# Patient Record
Sex: Female | Born: 1960 | Race: Black or African American | Hispanic: No | Marital: Single | Smoking: Former smoker
Health system: Southern US, Community
[De-identification: ages and names within clinical notes are randomized; demographics above are authoritative.]

## PROBLEM LIST (undated history)

## (undated) DIAGNOSIS — I10 Essential (primary) hypertension: Secondary | ICD-10-CM

## (undated) HISTORY — DX: Essential (primary) hypertension: I10

## (undated) HISTORY — PX: ABCESS DRAINAGE: SHX399

---

## 2007-01-19 ENCOUNTER — Emergency Department (HOSPITAL_COMMUNITY): Admission: EM | Admit: 2007-01-19 | Discharge: 2007-01-19 | Payer: Self-pay | Admitting: Family Medicine

## 2007-02-08 ENCOUNTER — Emergency Department (HOSPITAL_COMMUNITY): Admission: EM | Admit: 2007-02-08 | Discharge: 2007-02-08 | Payer: Self-pay | Admitting: Emergency Medicine

## 2007-02-22 ENCOUNTER — Emergency Department (HOSPITAL_COMMUNITY): Admission: EM | Admit: 2007-02-22 | Discharge: 2007-02-22 | Payer: Self-pay | Admitting: Emergency Medicine

## 2007-04-25 ENCOUNTER — Ambulatory Visit: Payer: Self-pay | Admitting: *Deleted

## 2007-04-25 ENCOUNTER — Ambulatory Visit: Payer: Self-pay | Admitting: Internal Medicine

## 2007-04-26 ENCOUNTER — Encounter (INDEPENDENT_AMBULATORY_CARE_PROVIDER_SITE_OTHER): Payer: Self-pay | Admitting: Nurse Practitioner

## 2007-04-26 LAB — CONVERTED CEMR LAB
ALT: 18 units/L (ref 0–35)
AST: 17 units/L (ref 0–37)
Alkaline Phosphatase: 80 units/L (ref 39–117)
BUN: 13 mg/dL (ref 6–23)
Basophils Absolute: 0.1 10*3/uL (ref 0.0–0.1)
Basophils Relative: 1 % (ref 0–1)
Calcium: 8.7 mg/dL (ref 8.4–10.5)
Chloride: 109 meq/L (ref 96–112)
Creatinine, Ser: 0.91 mg/dL (ref 0.40–1.20)
Eosinophils Absolute: 0.1 10*3/uL (ref 0.0–0.7)
HDL: 50 mg/dL (ref >39)
Hemoglobin: 12.1 g/dL (ref 12.0–15.0)
LDL Cholesterol: 99 mg/dL (ref 0–99)
MCHC: 30.5 g/dL (ref 30.0–36.0)
MCV: 89.2 fL (ref 78.0–100.0)
Monocytes Absolute: 0.4 10*3/uL (ref 0.2–0.7)
Neutro Abs: 1.3 10*3/uL — ABNORMAL LOW (ref 1.7–7.7)
RDW: 15.7 % — ABNORMAL HIGH (ref 11.5–14.0)
TSH: 1.194 microintl units/mL (ref 0.350–5.50)
Total Bilirubin: 0.3 mg/dL (ref 0.3–1.2)
Total CHOL/HDL Ratio: 3.3
VLDL: 15 mg/dL (ref 0–40)

## 2007-06-11 ENCOUNTER — Emergency Department (HOSPITAL_COMMUNITY): Admission: EM | Admit: 2007-06-11 | Discharge: 2007-06-11 | Payer: Self-pay | Admitting: Emergency Medicine

## 2007-06-20 ENCOUNTER — Ambulatory Visit: Payer: Self-pay | Admitting: Internal Medicine

## 2008-03-17 ENCOUNTER — Ambulatory Visit: Payer: Self-pay | Admitting: Internal Medicine

## 2009-01-12 ENCOUNTER — Emergency Department (HOSPITAL_COMMUNITY): Admission: EM | Admit: 2009-01-12 | Discharge: 2009-01-12 | Payer: Self-pay | Admitting: Emergency Medicine

## 2009-01-13 ENCOUNTER — Ambulatory Visit (HOSPITAL_COMMUNITY): Admission: RE | Admit: 2009-01-13 | Discharge: 2009-01-14 | Payer: Self-pay | Admitting: General Surgery

## 2010-07-06 ENCOUNTER — Emergency Department (HOSPITAL_COMMUNITY): Admission: EM | Admit: 2010-07-06 | Discharge: 2010-07-06 | Payer: Self-pay | Admitting: Family Medicine

## 2011-01-12 LAB — POCT I-STAT, CHEM 8
Calcium, Ion: 1.08 mmol/L — ABNORMAL LOW (ref 1.12–1.32)
HCT: 33 % — ABNORMAL LOW (ref 36.0–46.0)
Hemoglobin: 11.2 g/dL — ABNORMAL LOW (ref 12.0–15.0)
Sodium: 141 mEq/L (ref 135–145)
TCO2: 23 mmol/L (ref 0–100)

## 2011-01-12 LAB — BASIC METABOLIC PANEL
CO2: 25 mEq/L (ref 19–32)
Calcium: 8.4 mg/dL (ref 8.4–10.5)
Calcium: 8.7 mg/dL (ref 8.4–10.5)
Creatinine, Ser: 0.9 mg/dL (ref 0.4–1.2)
Creatinine, Ser: 0.95 mg/dL (ref 0.4–1.2)
GFR calc Af Amer: >60 mL/min (ref >60)
GFR calc Af Amer: >60 mL/min (ref >60)
GFR calc non Af Amer: >60 mL/min (ref >60)
Sodium: 138 mEq/L (ref 135–145)
Sodium: 139 mEq/L (ref 135–145)

## 2011-01-12 LAB — HCG, SERUM, QUALITATIVE: Preg, Serum: NEGATIVE

## 2011-01-12 LAB — CULTURE, ROUTINE-ABSCESS

## 2011-01-12 LAB — CBC
Hemoglobin: 10 g/dL — ABNORMAL LOW (ref 12.0–15.0)
Hemoglobin: 11.1 g/dL — ABNORMAL LOW (ref 12.0–15.0)
MCHC: 32.7 g/dL (ref 30.0–36.0)
RBC: 3.5 MIL/uL — ABNORMAL LOW (ref 3.87–5.11)
RBC: 3.9 MIL/uL (ref 3.87–5.11)
WBC: 7.9 10*3/uL (ref 4.0–10.5)

## 2011-01-12 LAB — ANAEROBIC CULTURE

## 2011-02-15 NOTE — Discharge Summary (Signed)
Betty Adams, Betty Adams               ACCOUNT NO.:  [id number]   MEDICAL RECORD NO.:  [id number]          PATIENT TYPE:  OIB   LOCATION:  1524                         FACILITY:  [facility]   PHYSICIAN:  Clovis Pu. Cornett, M.D.DATE OF BIRTH:  May 08, 1961   DATE OF ADMISSION:  01/13/2009  DATE OF DISCHARGE:  01/14/2009                               DISCHARGE SUMMARY   ADMITTING DIAGNOSIS:  Right posterior thigh abscess.   DISCHARGE DIAGNOSIS:  Right posterior thigh abscess.   PROCEDURE PERFORMED:  Incision and drainage of right posterior thigh  abscess.   BRIEF HISTORY:  The patient is a 50 year old female with a one-week  history of posterior right thigh abscess.  She had seen Dr. Donell Beers in  the urgent office and was sent to [facility] for drainage.   HOSPITAL COURSE:  Her hospital course was unremarkable.  She underwent  incision and drainage of right posterior thigh abscess without  difficulty.   DISCHARGE INSTRUCTIONS:  She was discharged home on postoperative day  #1.  Home health will be arranged for wound care.  She will be sent home  on Augmentin 875 mg p.o. b.i.d., Percocet 1-2 tablets q.4h. p.r.n. pain.  I also gave her a prescription for clindamycin 300 mg p.o. t.i.d. if the  Augmentin is too expensive for her.  She will return to clinic in  roughly 2 weeks, and home health will be arranged.   CONDITION AT DISCHARGE:  Improved.      Thomas A. Cornett, M.D.  Electronically Signed     TAC/MEDQ  D:  01/14/2009  T:  01/14/2009  Job:  [id number]

## 2011-02-15 NOTE — Op Note (Signed)
NAMEBRIANI, MAUL NO.:  [id number]   MEDICAL RECORD NO.:  [id number]          PATIENT TYPE:  OUT   LOCATION:  DAY                          FACILITY:  [facility]   PHYSICIAN:  Clovis Pu. Cornett, M.D.DATE OF BIRTH:  06-01-1961   DATE OF PROCEDURE:  01/13/2009  DATE OF DISCHARGE:                               OPERATIVE REPORT   PREOPERATIVE DIAGNOSIS:  Right thigh abscess.   POSTOPERATIVE DIAGNOSIS:  Right thigh abscess.   PROCEDURE:  Incision and drainage of right thigh abscess.   SURGEON:  Maisie Fus A. Cornett, MD.   ANESTHESIA:  General endotracheal anesthesia.   ESTIMATED BLOOD LOSS:  5 mL.   SPECIMENS:  Cultures taken from right thigh abscess.   INDICATIONS FOR PROCEDURE:  The patient is a 50 year old female, who has  had a 1-week history of a slowly progressive abscess on her right  posterior thigh.  She was seen today in the office by Dr. Almond Lint.  It was too large to be drained in the office, and she was sent here to  [facility] to have her abscess drained.  I saw the patient in the  holding area, discussed the procedure with her, reviewed her records,  and concurred with Dr. Arita Miss assessment.  She agreed to proceed.   DESCRIPTION OF PROCEDURE:  The patient was brought to the operating  room.  She was initially placed supine and intubated.  She was then  placed with her left side down, appropriately padded, and raised her  right thigh to the area of the posterior aspect of her posterior right  thigh.  This area was prepped and draped in a usual fashion and measured  about 3 cm.  I opened the central part of this and debrided the center  part of necrotic skin.  About a 3 cm abscess cavity was encountered.  There was some residual pus and loculation that I broke up and then I  irrigated this out and packed it after obtaining hemostasis with a  saline-soaked gauze.  Dry dressings were applied.  The patient was taken  out of her left-side-down  position, placed supine, extubated, and taken  to recovery in satisfactory condition.  All catheters, sponge, and  instrument counts found to be correct.      Thomas A. Cornett, M.D.  Electronically Signed    TAC/MEDQ  D:  01/13/2009  T:  01/13/2009  Job:  [id number]   cc:   Maurice March, M.D.  Fax: [phone]

## 2011-02-15 NOTE — H&P (Signed)
NAMETAELAR, GRONEWOLD NO.:  [id number]   MEDICAL RECORD NO.:  [id number]          PATIENT TYPE:  EMS   LOCATION:  MAJO                         FACILITY:  [facility]   PHYSICIAN:  Almond Lint, MD       DATE OF BIRTH:  Apr 05, 1961   DATE OF ADMISSION:  01/12/2009  DATE OF DISCHARGE:                              HISTORY & PHYSICAL   CHIEF COMPLAINT:  Right thigh abscess.   HISTORY OF PRESENT ILLNESS:  Ms. Masri is a 50 year old female with  around a week history of a right thigh wound.  She thought this started  as a spider bite and applied some warm compresses.  It kept getting  worse and larger with swelling and pain and so she went to the emergency  department last night.  It had started draining some so they gave her IM  Rocephin and had her follow up with [place] today.  She continues to have  severe pain, redness and swelling of this area and minimal drainage.   PAST MEDICAL HISTORY:  Significant for asthma.   PAST SURGICAL HISTORY:  BTL.   MEDICATIONS:  P.r.n. albuterol.   FAMILY HISTORY:  Mother with breast cancer and diabetes.   SOCIAL HISTORY:  She is single and smokes 1 packet of cigarettes every 3  days.   REVIEW OF SYSTEMS:  Otherwise negative x10 systems.   PHYSICAL EXAMINATION:  Temperature 98.2, pulse 87, blood pressure  142/84.  Height 5 feet 4 inches.  Weight 264 pounds.  Alert and oriented  x3, obviously uncomfortable.  Normal mood and affect.  ABDOMEN: Soft.  Her right upper thigh demonstrates a fluctuant lesion that is  exquisitely tender.  Even brief touches make her extremely  uncomfortable.  There is a small punctate area that has some drainage  coming out of it, but there is still fluctuant region of around 2 cm  underlying this.  EXTREMITIES:  Demonstrate no edema.   ASSESSMENT/PLAN:  Ms. Canny is a 50 year old female with a right  posterior thigh abscess.  We will place her on intravenous antibiotics  and give her intravenous pain  control.  She will undergo incision and  drainage in the operating room tonight.  I have discussed this with  Dr.  Luisa Hart.      Almond Lint, MD  Electronically Signed    FB/MEDQ  D:  01/13/2009  T:  01/13/2009  Job:  [id number]

## 2011-08-17 ENCOUNTER — Emergency Department (HOSPITAL_COMMUNITY)
Admission: EM | Admit: 2011-08-17 | Discharge: 2011-08-17 | Disposition: A | Discharge status: Home / Self Care | Payer: Self-pay | Attending: Emergency Medicine | Admitting: Emergency Medicine

## 2011-08-17 ENCOUNTER — Encounter: Payer: Self-pay | Admitting: *Deleted

## 2011-08-17 DIAGNOSIS — M79609 Pain in unspecified limb: Secondary | ICD-10-CM | POA: Insufficient documentation

## 2011-08-17 DIAGNOSIS — M79673 Pain in unspecified foot: Secondary | ICD-10-CM

## 2011-08-17 DIAGNOSIS — J45909 Unspecified asthma, uncomplicated: Secondary | ICD-10-CM | POA: Insufficient documentation

## 2011-08-17 MED ORDER — HYDROCODONE-ACETAMINOPHEN 5-325 MG PO TABS: 2.0000 | ORAL_TABLET | ORAL | Status: AC | PRN

## 2011-08-17 NOTE — ED Notes (Signed)
Bilateral  Lower edema swelling to both legs and feet has been going on for several months pain has been ggoing on for  Several days, states works on her feet all day usually [facility]

## 2011-08-17 NOTE — ED Provider Notes (Signed)
History     CSN: [id number] Arrival date & time: 08/17/2011  3:19 PM   First MD Initiated Contact with Patient 08/17/11 1854      Chief Complaint  Patient presents with  . Foot Pain    (Consider location/radiation/quality/duration/timing/severity/associated sxs/prior treatment) HPI Point of bilateral foot pain at heels for 4 months becoming worse over the past several weeks pain is worse with weightbearing improved with keeping feet up. Treated with ibuprofen without relief pain is at heels sharp in quality severe with weightbearing. No associated injury no fever no other complaint Past Medical History  Diagnosis Date  . Asthma     History reviewed. No pertinent past surgical history.  History reviewed. No pertinent family history.  History  Substance Use Topics  . Smoking status: Never Smoker   . Smokeless tobacco: Not on file  . Alcohol Use: No    OB History    Grav Para Term Preterm Abortions TAB SAB Ect Mult Living                  Review of Systems  Constitutional: Negative.   Respiratory: Negative.   Musculoskeletal:       Bilateral heel pain  Skin: Negative.   Neurological: Negative.   Psychiatric/Behavioral: Negative.     Allergies  Review of patient's allergies indicates no known allergies.  Home Medications   Current Outpatient Rx  Name Route Sig Dispense Refill  . ALBUTEROL SULFATE HFA 108 (90 BASE) MCG/ACT IN AERS Inhalation Inhale 2 puffs into the lungs every 6 (six) hours as needed. For shortness of breath     . FLOVENT IN Inhalation Inhale 2 puffs into the lungs 3 times/day as needed-between meals & bedtime. For shortness of breath       BP 153/89  Pulse 65  Temp(Src) 97.8 F (36.6 C) (Oral)  Resp 16  SpO2 97%  LMP 07/12/2011  Physical Exam  Nursing note and vitals reviewed. Constitutional: She is oriented to person, place, and time. She appears well-developed and well-nourished. No distress.  HENT:  Head: Normocephalic and  atraumatic.  Eyes: EOM are normal.  Neck: Normal range of motion.  Cardiovascular: Normal rate.   Pulmonary/Chest: No respiratory distress.  Abdominal:       Morbidly OBESE  Musculoskeletal: Normal range of motion. She exhibits no edema.       Bilateral heels tender, no redness warmth or fluctuance  Neurological: She is alert and oriented to person, place, and time.  Skin: Skin is warm and dry.  Psychiatric: She has a normal mood and affect.    ED Course  Procedures (including critical care time)  Labs Reviewed - No data to display No results found.   No diagnosis found.    MDM  No signs of infection pain is chronic. Heel spurs or a possibility. Plan prescription hydrocodone-A. Pap podiatry referral Diagnosis heel pain        Doug Sou, MD 08/17/11 1940

## 2011-08-17 NOTE — ED Notes (Signed)
Chronic bilateral foot pain.

## 2011-08-22 ENCOUNTER — Encounter (HOSPITAL_COMMUNITY): Payer: Self-pay | Admitting: *Deleted

## 2011-08-22 ENCOUNTER — Emergency Department (HOSPITAL_COMMUNITY)
Admission: EM | Admit: 2011-08-22 | Discharge: 2011-08-22 | Disposition: A | Discharge status: Home / Self Care | Payer: Self-pay | Attending: Emergency Medicine | Admitting: Emergency Medicine

## 2011-08-22 DIAGNOSIS — M79673 Pain in unspecified foot: Secondary | ICD-10-CM

## 2011-08-22 DIAGNOSIS — J45909 Unspecified asthma, uncomplicated: Secondary | ICD-10-CM | POA: Insufficient documentation

## 2011-08-22 DIAGNOSIS — M79609 Pain in unspecified limb: Secondary | ICD-10-CM | POA: Insufficient documentation

## 2011-08-22 DIAGNOSIS — M25579 Pain in unspecified ankle and joints of unspecified foot: Secondary | ICD-10-CM | POA: Insufficient documentation

## 2011-08-22 MED ORDER — MELOXICAM 7.5 MG PO TABS: 7.5000 mg | ORAL_TABLET | Freq: Two times a day (BID) | ORAL | Status: DC

## 2011-08-22 NOTE — ED Notes (Signed)
The pt has had bi-lateral foot paion for the past 2 months.  Worse for the past 2 weeks .  She was  Here last Wednesday.

## 2011-08-22 NOTE — ED Provider Notes (Signed)
Medical screening examination/treatment/procedure(s) were performed by non-physician practitioner and as supervising physician I was immediately available for consultation/collaboration.   Lancer Thurner L Janiah Devinney, MD 08/22/11 2211 

## 2011-08-22 NOTE — ED Provider Notes (Signed)
History     CSN: [id number] Arrival date & time: 08/22/2011  4:31 PM   First MD Initiated Contact with Patient 08/22/11 2010      Chief Complaint  Patient presents with  . Foot Pain    (Consider location/radiation/quality/duration/timing/severity/associated sxs/prior treatment) HPI Comments: Patient with bilateral foot pain for years recently worse points to arch area  Was seen recently for same and referred to foot specialist but has not made appointment yet Has been taking Vicodin without relief   Patient is a 50 y.o. female presenting with lower extremity pain. The history is provided by the patient.  Foot Pain This is a chronic problem. The current episode started more than 1 year ago. The problem occurs constantly. The problem has been gradually worsening. Associated symptoms include arthralgias. Pertinent negatives include no fever, numbness or weakness. The symptoms are aggravated by walking. She has tried position changes for the symptoms. The treatment provided no relief.    Past Medical History  Diagnosis Date  . Asthma     History reviewed. No pertinent past surgical history.  History reviewed. No pertinent family history.  History  Substance Use Topics  . Smoking status: Never Smoker   . Smokeless tobacco: Not on file  . Alcohol Use: No    OB History    Grav Para Term Preterm Abortions TAB SAB Ect Mult Living                  Review of Systems  Constitutional: Positive for activity change. Negative for fever.  HENT: Negative for facial swelling.   Eyes: Negative.   Respiratory: Negative.   Cardiovascular: Negative.   Gastrointestinal: Negative.   Genitourinary: Negative.   Musculoskeletal: Positive for arthralgias.  Neurological: Negative for weakness and numbness.  Hematological: Negative.   Psychiatric/Behavioral: Negative.     Allergies  Review of patient's allergies indicates no known allergies.  Home Medications   Current Outpatient Rx    Name Route Sig Dispense Refill  . ALBUTEROL SULFATE HFA 108 (90 BASE) MCG/ACT IN AERS Inhalation Inhale 2 puffs into the lungs every 6 (six) hours as needed. For shortness of breath     . HYDROCODONE-ACETAMINOPHEN 5-325 MG PO TABS Oral Take 2 tablets by mouth every 4 (four) hours as needed for pain. 12 tablet 0  . MELOXICAM 7.5 MG PO TABS Oral Take 1 tablet (7.5 mg total) by mouth 2 (two) times daily. 20 tablet 0    BP 154/95  Pulse 73  Temp(Src) 98.1 F (36.7 C) (Oral)  Resp 20  SpO2 97%  LMP 07/12/2011  Physical Exam  Constitutional: She is oriented to person, place, and time. She appears well-nourished.  HENT:  Head: Atraumatic.  Eyes: EOM are normal.  Neck: Neck supple.  Cardiovascular: Regular rhythm.   Pulmonary/Chest: Breath sounds normal.  Musculoskeletal: She exhibits no edema and no tenderness.       Flat feet no pain with stretching but pain directly in arch area   Neurological: She is oriented to person, place, and time.  Skin: Skin is warm and dry.    ED Course  Procedures (including critical care time)  Labs Reviewed - No data to display No results found.   1. Foot arch pain       MDM  Flat feet not indicative of plantar faciatitis        Arman Filter, NP 08/22/11 2037  Arman Filter, NP 08/22/11 2108

## 2011-08-22 NOTE — ED Notes (Signed)
Pt c/o bil foot pain for several days. No known injury.  Painful to bear wt.

## 2011-09-26 ENCOUNTER — Encounter (HOSPITAL_COMMUNITY): Payer: Self-pay | Admitting: *Deleted

## 2011-09-26 ENCOUNTER — Emergency Department (HOSPITAL_COMMUNITY)
Admission: EM | Admit: 2011-09-26 | Discharge: 2011-09-26 | Disposition: A | Discharge status: Home / Self Care | Payer: Self-pay | Attending: Emergency Medicine | Admitting: Emergency Medicine

## 2011-09-26 DIAGNOSIS — R05 Cough: Secondary | ICD-10-CM | POA: Insufficient documentation

## 2011-09-26 DIAGNOSIS — R079 Chest pain, unspecified: Secondary | ICD-10-CM | POA: Insufficient documentation

## 2011-09-26 DIAGNOSIS — R0602 Shortness of breath: Secondary | ICD-10-CM | POA: Insufficient documentation

## 2011-09-26 DIAGNOSIS — R059 Cough, unspecified: Secondary | ICD-10-CM | POA: Insufficient documentation

## 2011-09-26 DIAGNOSIS — J111 Influenza due to unidentified influenza virus with other respiratory manifestations: Secondary | ICD-10-CM | POA: Insufficient documentation

## 2011-09-26 DIAGNOSIS — R070 Pain in throat: Secondary | ICD-10-CM | POA: Insufficient documentation

## 2011-09-26 DIAGNOSIS — J45901 Unspecified asthma with (acute) exacerbation: Secondary | ICD-10-CM | POA: Insufficient documentation

## 2011-09-26 DIAGNOSIS — R51 Headache: Secondary | ICD-10-CM | POA: Insufficient documentation

## 2011-09-26 MED ORDER — ALBUTEROL SULFATE (5 MG/ML) 0.5% IN NEBU
5.0000 mg | INHALATION_SOLUTION | Freq: Once | RESPIRATORY_TRACT | Status: AC
  Administered 2011-09-26: 5 mg via RESPIRATORY_TRACT
  Filled 2011-09-26 (×2): qty 0.5

## 2011-09-26 MED ORDER — OSELTAMIVIR PHOSPHATE 75 MG PO CAPS: 75.0000 mg | ORAL_CAPSULE | Freq: Two times a day (BID) | ORAL | Status: AC

## 2011-09-26 MED ORDER — PREDNISONE 20 MG PO TABS: 60.0000 mg | ORAL_TABLET | Freq: Every day | ORAL | Status: AC

## 2011-09-26 MED ORDER — IPRATROPIUM BROMIDE 0.02 % IN SOLN
0.5000 mg | Freq: Once | RESPIRATORY_TRACT | Status: AC
  Administered 2011-09-26: 0.5 mg via RESPIRATORY_TRACT
  Filled 2011-09-26: qty 2.5

## 2011-09-26 MED ORDER — PREDNISONE 20 MG PO TABS
60.0000 mg | ORAL_TABLET | Freq: Once | ORAL | Status: AC
  Administered 2011-09-26: 60 mg via ORAL
  Filled 2011-09-26: qty 3

## 2011-09-26 MED ORDER — ALBUTEROL SULFATE HFA 108 (90 BASE) MCG/ACT IN AERS: 2.0000 | INHALATION_SPRAY | Freq: Four times a day (QID) | RESPIRATORY_TRACT | Status: DC | PRN

## 2011-09-26 NOTE — ED Notes (Signed)
Pt receiving nebulizer treatment at the time. No signs of distress noted.

## 2011-09-26 NOTE — ED Provider Notes (Signed)
History     CSN: [id number]  Arrival date & time 09/26/11  0801   First MD Initiated Contact with Patient 09/26/11 [phone]      Chief Complaint  Patient presents with  . Asthma    (Consider location/radiation/quality/duration/timing/severity/associated sxs/prior treatment) Patient is a 50 y.o. female presenting with asthma. The history is provided by the patient.  Asthma This is a recurrent problem. The current episode started yesterday. The problem occurs constantly. The problem has not changed since onset.Associated symptoms include chest pain, headaches and shortness of breath. The symptoms are aggravated by nothing. The symptoms are relieved by nothing.  Pt has been having a dry cough.  She started having symptoms last night and went in to work today.  She did not feel well enough to work so she came to the ED.  She ran out of her inhaler.  Past Medical History  Diagnosis Date  . Asthma     No past surgical history on file.  No family history on file.  History  Substance Use Topics  . Smoking status: Former Games developer  . Smokeless tobacco: Not on file  . Alcohol Use: No    OB History    Grav Para Term Preterm Abortions TAB SAB Ect Mult Living                  Review of Systems  Constitutional: Negative for fever.       Some general body aches  HENT: Positive for sore throat.   Respiratory: Positive for cough and shortness of breath.   Cardiovascular: Positive for chest pain.  Neurological: Positive for headaches.  All other systems reviewed and are negative.    Allergies  Review of patient's allergies indicates no known allergies.  Home Medications   Current Outpatient Rx  Name Route Sig Dispense Refill  . ALBUTEROL SULFATE HFA 108 (90 BASE) MCG/ACT IN AERS Inhalation Inhale 2 puffs into the lungs every 6 (six) hours as needed. For shortness of breath     . MELOXICAM 7.5 MG PO TABS Oral Take 1 tablet (7.5 mg total) by mouth 2 (two) times daily. 20 tablet 0      SpO2 99%  LMP 07/04/2011  Physical Exam  Nursing note and vitals reviewed. Constitutional: She appears well-developed and well-nourished. No distress.  HENT:  Head: Normocephalic and atraumatic.  Right Ear: External ear normal.  Left Ear: External ear normal.  Eyes: Conjunctivae are normal. Right eye exhibits no discharge. Left eye exhibits no discharge. No scleral icterus.  Neck: Neck supple. No tracheal deviation present.  Cardiovascular: Normal rate, regular rhythm and intact distal pulses.   Pulmonary/Chest: Effort normal. No accessory muscle usage or stridor. Not tachypneic. No respiratory distress. She has wheezes in the right lower field and the left lower field. She has no rales.       Few wheezes  Abdominal: Soft. Bowel sounds are normal. She exhibits no distension. There is no tenderness. There is no rebound and no guarding.  Musculoskeletal: She exhibits no edema and no tenderness.  Neurological: She is alert. She has normal strength. No sensory deficit. Cranial nerve deficit:  no gross defecits noted. She exhibits normal muscle tone. She displays no seizure activity. Coordination normal.  Skin: Skin is warm and dry. No rash noted.  Psychiatric: She has a normal mood and affect.    ED Course  Procedures (including critical care time)  Medications  albuterol (PROVENTIL HFA;VENTOLIN HFA) 108 (90 BASE) MCG/ACT inhaler (not administered)  predniSONE (DELTASONE) 20 MG tablet (not administered)  oseltamivir (TAMIFLU) 75 MG capsule (not administered)  albuterol (PROVENTIL) (5 MG/ML) 0.5% nebulizer solution 5 mg (5 mg Nebulization Given 09/26/11 0840)  ipratropium (ATROVENT) nebulizer solution 0.5 mg (0.5 mg Nebulization Given 09/26/11 0840)  predniSONE (DELTASONE) tablet 60 mg (60 mg Oral Given 09/26/11 0840)    Labs Reviewed - No data to display No results found.   1. Asthma   2. Influenza       MDM  Pt was given an albuterol neb with good response.  No  distress noted in the ED.  Pt feeling better.  Will dc home with meds.  Will cover for the flu.  Pt works at a healthcare facility.        Celene Kras, MD 09/26/11 [phone]

## 2011-09-26 NOTE — ED Notes (Signed)
Patient resting comfortably on stretcher airway intact bilateral equal chest rise and fall. States feels better with treatment.

## 2011-09-26 NOTE — ED Notes (Signed)
Pt from home c/o asthma attack this am onset 02:00, pt normally uses rescue inhaler, Albuterol, but the pt has ran out of her medication, pt denies CP c/o SOB & dizziness, NAD

## 2012-05-10 ENCOUNTER — Encounter (HOSPITAL_COMMUNITY): Payer: Self-pay | Admitting: *Deleted

## 2012-05-10 ENCOUNTER — Emergency Department (HOSPITAL_COMMUNITY)
Admission: EM | Admit: 2012-05-10 | Discharge: 2012-05-10 | Disposition: A | Discharge status: Home / Self Care | Payer: Self-pay | Attending: Emergency Medicine | Admitting: Emergency Medicine

## 2012-05-10 DIAGNOSIS — Z79899 Other long term (current) drug therapy: Secondary | ICD-10-CM | POA: Insufficient documentation

## 2012-05-10 DIAGNOSIS — J45909 Unspecified asthma, uncomplicated: Secondary | ICD-10-CM | POA: Insufficient documentation

## 2012-05-10 DIAGNOSIS — X500XXA Overexertion from strenuous movement or load, initial encounter: Secondary | ICD-10-CM | POA: Insufficient documentation

## 2012-05-10 DIAGNOSIS — M545 Low back pain, unspecified: Secondary | ICD-10-CM | POA: Insufficient documentation

## 2012-05-10 DIAGNOSIS — Z87891 Personal history of nicotine dependence: Secondary | ICD-10-CM | POA: Insufficient documentation

## 2012-05-10 DIAGNOSIS — M549 Dorsalgia, unspecified: Secondary | ICD-10-CM

## 2012-05-10 DIAGNOSIS — Y9269 Other specified industrial and construction area as the place of occurrence of the external cause: Secondary | ICD-10-CM | POA: Insufficient documentation

## 2012-05-10 MED ORDER — MORPHINE SULFATE 10 MG/ML IJ SOLN
10.0000 mg | Freq: Once | INTRAMUSCULAR | Status: DC
  Filled 2012-05-10: qty 1

## 2012-05-10 MED ORDER — NAPROXEN 500 MG PO TABS: 500.0000 mg | ORAL_TABLET | Freq: Two times a day (BID) | ORAL | Status: DC

## 2012-05-10 MED ORDER — OXYCODONE-ACETAMINOPHEN 5-325 MG PO TABS
2.0000 | ORAL_TABLET | Freq: Once | ORAL | Status: AC
  Administered 2012-05-10: 2 via ORAL
  Filled 2012-05-10: qty 2

## 2012-05-10 MED ORDER — PREDNISONE 20 MG PO TABS: ORAL_TABLET | ORAL | Status: AC

## 2012-05-10 MED ORDER — ALBUTEROL SULFATE (5 MG/ML) 0.5% IN NEBU
2.5000 mg | INHALATION_SOLUTION | Freq: Once | RESPIRATORY_TRACT | Status: AC
  Administered 2012-05-10: 2.5 mg via RESPIRATORY_TRACT
  Filled 2012-05-10: qty 20

## 2012-05-10 MED ORDER — ALBUTEROL SULFATE HFA 108 (90 BASE) MCG/ACT IN AERS
2.0000 | INHALATION_SPRAY | Freq: Once | RESPIRATORY_TRACT | Status: AC
  Administered 2012-05-10: 2 via RESPIRATORY_TRACT
  Filled 2012-05-10: qty 6.7

## 2012-05-10 MED ORDER — PREDNISONE 20 MG PO TABS
60.0000 mg | ORAL_TABLET | Freq: Once | ORAL | Status: AC
  Administered 2012-05-10: 60 mg via ORAL
  Filled 2012-05-10: qty 3

## 2012-05-10 MED ORDER — DIPHENHYDRAMINE HCL 25 MG PO CAPS
25.0000 mg | ORAL_CAPSULE | Freq: Once | ORAL | Status: AC
  Administered 2012-05-10: 25 mg via ORAL
  Filled 2012-05-10: qty 1

## 2012-05-10 MED ORDER — METHOCARBAMOL 500 MG PO TABS: 500.0000 mg | ORAL_TABLET | Freq: Two times a day (BID) | ORAL | Status: AC

## 2012-05-10 NOTE — ED Provider Notes (Signed)
Medical screening examination/treatment/procedure(s) were performed by non-physician practitioner and as supervising physician I was immediately available for consultation/collaboration.  Flint Melter, MD 05/10/12 2135

## 2012-05-10 NOTE — ED Provider Notes (Signed)
History     CSN: [id number]  Arrival date & time 05/10/12  0605   First MD Initiated Contact with Patient 05/10/12 [phone]      Chief Complaint  Patient presents with  . Back Pain    (Consider location/radiation/quality/duration/timing/severity/associated sxs/prior treatment) HPI Comments: Betty Adams 51 y.o. female   The chief complaint is: Patient presents with:   Back Pain  Past medical hx Asthma   Patient with 1 month hx of left sided back pain. 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  She  States that pain became inolerable yesterday and this morning when she woke up she had difficulty getting ourt of bed and felt that she would not be able to do her job. Deniesany known MOI.   Pain is left  Sided lumbar region and wraps aroud to LLQ of abdomen. Pain i constant and achey, sharp and worsened with movement or lying on left side.  Patient denies numbness, tingling, sxs of cauda equina,  Urinary sxs. Denies constipaion, diarrhea.  She has no PMH of kidney stones. Denies fevers, chills, fatigue, night sweats, unexplained weight loss. Patient also states that she is wheezing. This began t2 days ago along with itchy throat and nasal drainage.  States thea  She has allergies and this is normal for her this time of year. She is out of her albuterol inhaler, she has been hospitalized multiple times for asthma. She states that she  Only has to use her inhaler once a week. No night time sxs.  She is not using steroid or SABA.     Patient is a 51 y.o. female presenting with back pain. The history is provided by the patient and medical records. No language interpreter was used.  Back Pain  Associated symptoms include abdominal pain. Pertinent negatives include no chest pain, no dysuria and no weakness.    Past Medical History  Diagnosis Date  . Asthma     Past Surgical History  Procedure Date  . Abcess  drainage     History reviewed. No pertinent family history.  History  Substance Use Topics  . Smoking status: Former Games developer  . Smokeless tobacco: Not on file  . Alcohol Use: No    OB History    Grav Para Term Preterm Abortions TAB SAB Ect Mult Living                  Review of Systems  Constitutional: Negative.   Respiratory: Positive for cough, chest tightness and wheezing. Negative for shortness of breath and stridor.   Cardiovascular: Negative for chest pain.  Gastrointestinal: Positive for abdominal pain. Negative for nausea, vomiting, diarrhea, constipation and abdominal distention.  Genitourinary: Negative for dysuria, urgency, frequency, hematuria, flank pain, vaginal discharge and vaginal pain.  Musculoskeletal: Positive for back pain. Negative for joint swelling and gait problem.  Neurological: Negative for weakness.    Allergies  Review of patient's allergies indicates no known allergies.  Home Medications   Current Outpatient Rx  Name Route Sig Dispense Refill  . ALBUTEROL SULFATE HFA 108 (90 BASE) MCG/ACT IN AERS Inhalation Inhale 2 puffs into the lungs every 6 (six) hours as needed. For shortness of breath 2 Inhaler 1    BP 160/89  Pulse 65  Temp 98 F (36.7 C) (Oral)  SpO2 100%  Physical Exam  Nursing note and vitals reviewed. Constitutional: She is oriented to person, place, and time.  She appears well-developed and well-nourished. No distress.  HENT:  Head: Normocephalic and atraumatic.  Eyes: Conjunctivae are normal. No scleral icterus.  Neck: Normal range of motion.  Cardiovascular: Normal rate, regular rhythm and normal heart sounds.  Exam reveals no gallop and no friction rub.   No murmur heard. Pulmonary/Chest: Effort normal. No respiratory distress. She has wheezes. She has no rales. She exhibits no tenderness.  Abdominal: Soft. Bowel sounds are normal. She exhibits no distension and no mass. There is no tenderness. There is no guarding.    Musculoskeletal:       ttp of left lumbar paraspinals. No tendernes on spinous processes.  No CVA tenderness. ROM limited by pain. Patient is able to ambulate.  Neurological: She is alert and oriented to person, place, and time.       No weakness of extremities.  Skin: Skin is warm and dry. She is not diaphoretic.    ED Course  Procedures (including critical care time)  Labs Reviewed - No data to display No results found. 8:54 AM. BP 160/89  Pulse 65  Temp 98 F (36.7 C) (Oral)  SpO2 100% Patient seen and evaluated. Will give steroids and albuterol neb for wheezes and oral pain meds.  9:24 AM BP 160/89  Pulse 65  Temp 98 F (36.7 C) (Oral)  SpO2 100% Patient seen and evaluated. pain is decreased . She is breathing better.  Lungs: CTAB back pain improved after administration of percocet.   1. Back pain   2. Asthma       MDM   Discharge patient with stable vitals and improved pain.Discussed reasons to seek immediate care. Patient expresses understanding and agrees with plan.         Arthor Captain, PA-C 05/10/12 2033

## 2012-05-10 NOTE — ED Notes (Signed)
Pt c/o lower back pain x 1 month.  Denies urinary sx, n/v/d, SOB.

## 2012-05-10 NOTE — ED Notes (Signed)
Patient presents stating she has had increasing pain to her lower back.  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  She pulls the carts and can feel the pain in her lower back.

## 2013-04-04 ENCOUNTER — Encounter (HOSPITAL_COMMUNITY): Payer: Self-pay

## 2013-04-04 ENCOUNTER — Emergency Department (HOSPITAL_COMMUNITY)
Admission: EM | Admit: 2013-04-04 | Discharge: 2013-04-04 | Disposition: A | Discharge status: Home / Self Care | Payer: Self-pay | Attending: Emergency Medicine | Admitting: Emergency Medicine

## 2013-04-04 ENCOUNTER — Emergency Department (HOSPITAL_COMMUNITY): Payer: Self-pay

## 2013-04-04 DIAGNOSIS — F17200 Nicotine dependence, unspecified, uncomplicated: Secondary | ICD-10-CM | POA: Insufficient documentation

## 2013-04-04 DIAGNOSIS — Z79899 Other long term (current) drug therapy: Secondary | ICD-10-CM | POA: Insufficient documentation

## 2013-04-04 DIAGNOSIS — R609 Edema, unspecified: Secondary | ICD-10-CM | POA: Insufficient documentation

## 2013-04-04 DIAGNOSIS — J45909 Unspecified asthma, uncomplicated: Secondary | ICD-10-CM | POA: Insufficient documentation

## 2013-04-04 DIAGNOSIS — R600 Localized edema: Secondary | ICD-10-CM

## 2013-04-04 LAB — CBC WITH DIFFERENTIAL/PLATELET
Basophils Absolute: 0 10*3/uL (ref 0.0–0.1)
HCT: 35.3 % — ABNORMAL LOW (ref 36.0–46.0)
Hemoglobin: 11.5 g/dL — ABNORMAL LOW (ref 12.0–15.0)
Lymphocytes Relative: 59 % — ABNORMAL HIGH (ref 12–46)
Lymphs Abs: 3.3 10*3/uL (ref 0.7–4.0)
MCV: 86.1 fL (ref 78.0–100.0)
Monocytes Absolute: 0.3 10*3/uL (ref 0.1–1.0)
Monocytes Relative: 5 % (ref 3–12)
Neutro Abs: 1.8 10*3/uL (ref 1.7–7.7)
RBC: 4.1 MIL/uL (ref 3.87–5.11)
WBC: 5.6 10*3/uL (ref 4.0–10.5)

## 2013-04-04 LAB — BASIC METABOLIC PANEL
BUN: 11 mg/dL (ref 6–23)
CO2: 28 mEq/L (ref 19–32)
Chloride: 106 mEq/L (ref 96–112)
Creatinine, Ser: 0.87 mg/dL (ref 0.50–1.10)
Glucose, Bld: 97 mg/dL (ref 70–99)

## 2013-04-04 LAB — PRO B NATRIURETIC PEPTIDE: Pro B Natriuretic peptide (BNP): 20.8 pg/mL (ref 0–125)

## 2013-04-04 MED ORDER — TRAMADOL HCL 50 MG PO TABS
50.0000 mg | ORAL_TABLET | Freq: Once | ORAL | Status: AC
  Administered 2013-04-04: 50 mg via ORAL
  Filled 2013-04-04: qty 1

## 2013-04-04 MED ORDER — TRAMADOL HCL 50 MG PO TABS: 50.0000 mg | ORAL_TABLET | Freq: Four times a day (QID) | ORAL | Status: DC | PRN

## 2013-04-04 NOTE — ED Provider Notes (Signed)
History    CSN: [id number] Arrival date & time 04/04/13  0609  First MD Initiated Contact with Patient 04/04/13 [phone]     Chief Complaint  Patient presents with  . Leg Swelling   (Consider location/radiation/quality/duration/timing/severity/associated sxs/prior Treatment) HPI Comments: Patient is a 52 yo F PMHx significant for asthma presenting to the ED for two days of worsening painful bilateral ankle swelling. Patient states swelling and pain are worse on the left ankle than the right. Rates her pain 8/10. Aggravating factors include ambulating and weight bearing. Patient denies any exogenous estrogen use, active cancer, hx of DVT, CP, SOB, cough. Patient does not have a PCP that she has been to recently.    Past Medical History  Diagnosis Date  . Asthma    Past Surgical History  Procedure Laterality Date  . Abcess drainage     No family history on file. History  Substance Use Topics  . Smoking status: Current Every Day Smoker -- 0.50 packs/day    Types: Cigarettes  . Smokeless tobacco: Not on file  . Alcohol Use: No   OB History   Grav Para Term Preterm Abortions TAB SAB Ect Mult Living                 Review of Systems  Constitutional: Negative for fever and chills.  Respiratory: Negative for cough and shortness of breath.   Cardiovascular: Positive for leg swelling. Negative for chest pain and palpitations.  All other systems reviewed and are negative.    Allergies  Review of patient's allergies indicates no known allergies.  Home Medications   Current Outpatient Rx  Name  Route  Sig  Dispense  Refill  . albuterol (PROVENTIL HFA;VENTOLIN HFA) 108 (90 BASE) MCG/ACT inhaler   Inhalation   Inhale 2 puffs into the lungs every 6 (six) hours as needed. For shortness of breath   2 Inhaler   1   . traMADol (ULTRAM) 50 MG tablet   Oral   Take 1 tablet (50 mg total) by mouth every 6 (six) hours as needed for pain.   6 tablet   0    BP 157/97  Pulse 64   Temp(Src) 97.8 F (36.6 C) (Oral)  Resp 18  SpO2 96%  LMP 02/02/2013 Physical Exam  Constitutional: She is oriented to person, place, and time. She appears well-developed and well-nourished. No distress.  Obese female.   HENT:  Head: Normocephalic and atraumatic.  Eyes: Conjunctivae are normal.  Neck: Neck supple.  Cardiovascular: Normal rate, regular rhythm and normal heart sounds.   Palpable right DP and PT Dopplerable left DP.  Cap < 2 sec   Pulmonary/Chest: Effort normal and breath sounds normal. She has no wheezes. She has no rales.  Abdominal: Soft. There is no tenderness.  Musculoskeletal:  2+ non-pitting edema left leg.  1+ non-pitting edema right leg.   Neurological: She is alert and oriented to person, place, and time.  Skin: Skin is warm and dry. She is not diaphoretic. No erythema.    ED Course  Procedures (including critical care time) Labs Reviewed  CBC WITH DIFFERENTIAL - Abnormal; Notable for the following:    Hemoglobin 11.5 (*)    HCT 35.3 (*)    Platelets 141 (*)    Neutrophils Relative % 32 (*)    Lymphocytes Relative 59 (*)    All other components within normal limits  BASIC METABOLIC PANEL - Abnormal; Notable for the following:    GFR calc non Af  Amer 76 (*)    GFR calc Af Amer 89 (*)    All other components within normal limits  PRO B NATRIURETIC PEPTIDE   Dg Chest 2 View  04/04/2013   *RADIOLOGY REPORT*  Clinical Data: Leg swelling.  Shortness of breath.  CHEST - 2 VIEW  Comparison: None.  Findings: Heart size is normal.  Mediastinal shadows are normal. The lungs are clear.  No effusions.  Ordinary degenerative change effects the spine.  IMPRESSION: No active disease   Original Report Authenticated By: Paulina Fusi, M.D.   1. Bilateral leg edema     MDM  Patient presenting with bilateral lower extremity nonpitting edema with intact distal pulses and cap refill. Labs and imaging reviewed no signs of CHF or pulmonary edema. Patient is not need any  of DVT criteria for further investigation. Concern for acute emergent cause of edema. Pain management ED. Left ankle will be wrapped for support. Advised to find PCP for followup. She we will plan. Patient stable at time of discharge.  Jeannetta Ellis, PA-C 04/04/13 1007

## 2013-04-04 NOTE — ED Notes (Signed)
C/o bil lower ext swelling that started 2 days ago.  sts increasing difficulty to walk, pain.  nadn.

## 2013-04-04 NOTE — ED Provider Notes (Signed)
Medical screening examination/treatment/procedure(s) were performed by non-physician practitioner and as supervising physician I was immediately available for consultation/collaboration.  Kyrstyn Greear M Jaramie Bastos, MD 04/04/13 1758 

## 2019-05-04 ENCOUNTER — Telehealth (HOSPITAL_COMMUNITY): Payer: Self-pay | Admitting: *Deleted

## 2019-05-04 NOTE — Telephone Encounter (Signed)
Pt states she tested positive for Covid 19 through her PCP office.  Requesting Rx for inhaler.  Informed pt she would need to have an urgent care visit in order for [place] to prescribe anything, as she wasn't seen @ [facility] for Covid test.  Instructed pt she may do a virtual visit; instructions provided to assist pt in scheduling visit.  Pt verbalized understanding.

## 2020-11-30 ENCOUNTER — Other Ambulatory Visit: Payer: Self-pay

## 2020-11-30 ENCOUNTER — Ambulatory Visit (INDEPENDENT_AMBULATORY_CARE_PROVIDER_SITE_OTHER): Payer: 59

## 2020-11-30 ENCOUNTER — Encounter (HOSPITAL_COMMUNITY): Payer: Self-pay | Admitting: Emergency Medicine

## 2020-11-30 ENCOUNTER — Ambulatory Visit (HOSPITAL_COMMUNITY)
Admission: EM | Admit: 2020-11-30 | Discharge: 2020-11-30 | Disposition: A | Discharge status: Home / Self Care | Payer: 59 | Attending: Urgent Care | Admitting: Urgent Care

## 2020-11-30 DIAGNOSIS — M6281 Muscle weakness (generalized): Secondary | ICD-10-CM

## 2020-11-30 DIAGNOSIS — M5459 Other low back pain: Secondary | ICD-10-CM

## 2020-11-30 DIAGNOSIS — M5431 Sciatica, right side: Secondary | ICD-10-CM

## 2020-11-30 DIAGNOSIS — J4530 Mild persistent asthma, uncomplicated: Secondary | ICD-10-CM

## 2020-11-30 DIAGNOSIS — M7918 Myalgia, other site: Secondary | ICD-10-CM

## 2020-11-30 MED ORDER — ALBUTEROL SULFATE HFA 108 (90 BASE) MCG/ACT IN AERS: 2.0000 | INHALATION_SPRAY | Freq: Four times a day (QID) | RESPIRATORY_TRACT | 0 refills | Status: DC | PRN

## 2020-11-30 MED ORDER — TIZANIDINE HCL 4 MG PO TABS: 4.0000 mg | ORAL_TABLET | Freq: Every day | ORAL | 0 refills | Status: DC

## 2020-11-30 MED ORDER — PREDNISONE 20 MG PO TABS: ORAL_TABLET | ORAL | 0 refills | Status: DC

## 2020-11-30 NOTE — Discharge Instructions (Signed)
Please just use Tylenol at a dose of 500mg-650mg once every 6 hours as needed for your aches, pains, fevers. Do not use any nonsteroidal anti-inflammatories (NSAIDs) like ibuprofen, Motrin, naproxen, Aleve, etc. which are all available over-the-counter.   

## 2020-11-30 NOTE — ED Triage Notes (Signed)
Pt presents with right leg/ back pain xs 2 weeks. States has gotten worse over the past few days.   States has taken ibuprofen with some relief.

## 2020-11-30 NOTE — ED Provider Notes (Signed)
[facility]   MRN: [id number] DOB: 22-Apr-1961  Subjective:   Betty Adams is a 60 y.o. female presenting for 2-week history of persistent right-sided upper buttock pain that radiates laterally to the back of her leg down to her knee.  Denies any falls, trauma, weakness, numbness or tingling.  Denies any groin pain, dysuria, hematuria.  No history of kidney stones.  Has used ibuprofen with very temporary relief.  No current facility-administered medications for this encounter.  Current Outpatient Medications:  .  albuterol (PROVENTIL HFA;VENTOLIN HFA) 108 (90 BASE) MCG/ACT inhaler, Inhale 2 puffs into the lungs every 6 (six) hours as needed. For shortness of breath, Disp: 2 Inhaler, Rfl: 1 .  traMADol (ULTRAM) 50 MG tablet, Take 1 tablet (50 mg total) by mouth every 6 (six) hours as needed for pain., Disp: 6 tablet, Rfl: 0   No Known Allergies  Past Medical History:  Diagnosis Date  . Asthma      Past Surgical History:  Procedure Laterality Date  . ABCESS DRAINAGE      History reviewed. No pertinent family history.  Social History   Tobacco Use  . Smoking status: Current Every Day Smoker    Packs/day: 0.50    Types: Cigarettes  Substance Use Topics  . Alcohol use: No  . Drug use: No    ROS   Objective:   Vitals: BP (!) 148/99 (BP Location: Left Arm)   Pulse 94   Temp 98.2 F (36.8 C) (Oral)   Resp 19   SpO2 99%   Physical Exam Constitutional:      General: She is not in acute distress.    Appearance: Normal appearance. She is well-developed. She is obese. She is not ill-appearing, toxic-appearing or diaphoretic.  HENT:     Head: Normocephalic and atraumatic.     Nose: Nose normal.     Mouth/Throat:     Mouth: Mucous membranes are moist.     Pharynx: Oropharynx is clear.  Eyes:     General: No scleral icterus.    Extraocular Movements: Extraocular movements intact.     Pupils: Pupils are equal, round, and reactive to light.   Cardiovascular:     Rate and Rhythm: Normal rate.  Pulmonary:     Effort: Pulmonary effort is normal.  Musculoskeletal:     Lumbar back: Spasms and tenderness (Over area outlined) present. No swelling, edema, deformity, signs of trauma, lacerations or bony tenderness. Normal range of motion. Positive right straight leg raise test. Negative left straight leg raise test. No scoliosis.       Back:  Skin:    General: Skin is warm and dry.  Neurological:     General: No focal deficit present.     Mental Status: She is alert and oriented to person, place, and time.     Motor: No weakness.     Coordination: Coordination normal.     Gait: Gait normal.     Deep Tendon Reflexes: Reflexes normal.  Psychiatric:        Mood and Affect: Mood normal.        Behavior: Behavior normal.        Thought Content: Thought content normal.        Judgment: Judgment normal.     DG Lumbar Spine Complete  Result Date: 11/30/2020 CLINICAL DATA:  Low back pain and left leg weakness EXAM: LUMBAR SPINE - COMPLETE 4+ VIEW COMPARISON:  None. FINDINGS: Five lumbar type vertebral segments.  Vertebral body heights and alignment are maintained. No fracture identified. Intervertebral disc spaces are relatively preserved. Minimal degenerative endplate changes. Mild lower lumbar facet arthrosis. IMPRESSION: No acute findings.  Mild lower lumbar facet arthropathy. Electronically Signed   By: Duanne Guess D.O.   On: 11/30/2020 11:39   Assessment and Plan :   PDMP not reviewed this encounter.  1. Sciatica of right side   2. Right buttock pain   3. Mild persistent asthma without complication     Will manage for sciatica of the right side.  Use a prednisone course given lack of response to ibuprofen.  Recommended Tylenol, muscle relaxant. Counseled on back care.  Follow-up with PCP. Counseled patient on potential for adverse effects with medications prescribed/recommended today, ER and return-to-clinic precautions  discussed, patient verbalized understanding.    Wallis Bamberg, PA-C 11/30/20 1211

## 2020-12-24 ENCOUNTER — Ambulatory Visit: Payer: Self-pay | Admitting: Internal Medicine

## 2020-12-24 DIAGNOSIS — Z0289 Encounter for other administrative examinations: Secondary | ICD-10-CM

## 2021-01-04 ENCOUNTER — Other Ambulatory Visit: Payer: Self-pay

## 2021-01-04 ENCOUNTER — Encounter (HOSPITAL_COMMUNITY): Payer: Self-pay

## 2021-01-04 ENCOUNTER — Ambulatory Visit (HOSPITAL_COMMUNITY)
Admission: EM | Admit: 2021-01-04 | Discharge: 2021-01-04 | Disposition: A | Discharge status: Home / Self Care | Payer: 59 | Attending: Emergency Medicine | Admitting: Emergency Medicine

## 2021-01-04 ENCOUNTER — Ambulatory Visit (HOSPITAL_COMMUNITY): Payer: Self-pay

## 2021-01-04 DIAGNOSIS — R030 Elevated blood-pressure reading, without diagnosis of hypertension: Secondary | ICD-10-CM | POA: Diagnosis not present

## 2021-01-04 DIAGNOSIS — J0100 Acute maxillary sinusitis, unspecified: Secondary | ICD-10-CM | POA: Diagnosis not present

## 2021-01-04 DIAGNOSIS — J039 Acute tonsillitis, unspecified: Secondary | ICD-10-CM

## 2021-01-04 DIAGNOSIS — J029 Acute pharyngitis, unspecified: Secondary | ICD-10-CM

## 2021-01-04 LAB — POCT RAPID STREP A, ED / UC: Streptococcus, Group A Screen (Direct): NEGATIVE

## 2021-01-04 MED ORDER — AMOXICILLIN 875 MG PO TABS: 875.0000 mg | ORAL_TABLET | Freq: Two times a day (BID) | ORAL | 0 refills | Status: AC

## 2021-01-04 MED ORDER — FLUCONAZOLE 150 MG PO TABS: 150.0000 mg | ORAL_TABLET | Freq: Every day | ORAL | 0 refills | Status: DC

## 2021-01-04 NOTE — Discharge Instructions (Signed)
Take the amoxicillin and Diflucan as directed.  Follow up with your primary care provider if your symptoms are not improving.    Your blood pressure is elevated today at 154/107.  Please have this rechecked by your primary care provider in 2-4 weeks.

## 2021-01-04 NOTE — ED Provider Notes (Signed)
[facility]    CSN: [id number] Arrival date & time: 01/04/21  1440      History   Chief Complaint Chief Complaint  Patient presents with  . Sore Throat    HPI Betty Adams is a 60 y.o. female.   Patient presents with 1 month history of sore throat.  She states her throat is so painful that it hurts to swallow.  She also reports sinus pressure and postnasal drip.  She denies fever, chills, rash, cough, shortness of breath, vomiting, diarrhea, or other symptoms.  Treatment attempted at home with Tylenol.  Her medical history includes asthma.  The history is provided by the patient and medical records.    Past Medical History:  Diagnosis Date  . Asthma     There are no problems to display for this patient.   Past Surgical History:  Procedure Laterality Date  . ABCESS DRAINAGE      OB History   No obstetric history on file.      Home Medications    Prior to Admission medications   Medication Sig Start Date End Date Taking? Authorizing Provider  amoxicillin (AMOXIL) 875 MG tablet Take 1 tablet (875 mg total) by mouth 2 (two) times daily for 7 days. 01/04/21 01/11/21 Yes Mickie Bail, NP  fluconazole (DIFLUCAN) 150 MG tablet Take 1 tablet (150 mg total) by mouth daily. Take one tablet today.  May repeat in 3 days. 01/04/21  Yes Mickie Bail, NP  albuterol (VENTOLIN HFA) 108 (90 Base) MCG/ACT inhaler Inhale 2 puffs into the lungs every 6 (six) hours as needed. For shortness of breath 11/30/20   Wallis Bamberg, PA-C  predniSONE (DELTASONE) 20 MG tablet Take 2 tablets daily with breakfast. 11/30/20   Wallis Bamberg, PA-C  tiZANidine (ZANAFLEX) 4 MG tablet Take 1 tablet (4 mg total) by mouth at bedtime. 11/30/20   Wallis Bamberg, PA-C  traMADol (ULTRAM) 50 MG tablet Take 1 tablet (50 mg total) by mouth every 6 (six) hours as needed for pain. 04/04/13   PiepenbrinkVictorino Dike, PA-C    Family History Family History  Problem Relation Age of Onset  . Cancer Mother     Social  History Social History   Tobacco Use  . Smoking status: Former Smoker    Packs/day: 0.50    Types: Cigarettes  Substance Use Topics  . Alcohol use: No  . Drug use: No     Allergies   Patient has no known allergies.   Review of Systems Review of Systems  Constitutional: Negative for chills and fever.  HENT: Positive for congestion, postnasal drip, sore throat and trouble swallowing. Negative for ear pain.   Eyes: Negative for pain and visual disturbance.  Respiratory: Negative for cough and shortness of breath.   Cardiovascular: Negative for chest pain and palpitations.  Gastrointestinal: Negative for abdominal pain, diarrhea and vomiting.  Genitourinary: Negative for dysuria and hematuria.  Musculoskeletal: Negative for arthralgias and back pain.  Skin: Negative for color change and rash.  Neurological: Negative for seizures and syncope.  All other systems reviewed and are negative.    Physical Exam Triage Vital Signs ED Triage Vitals  Enc Vitals Group     BP      Pulse      Resp      Temp      Temp src      SpO2      Weight      Height  Head Circumference      Peak Flow      Pain Score      Pain Loc      Pain Edu?      Excl. in GC?    No data found.  Updated Vital Signs BP (!) 154/107   Pulse (!) 111   Temp 99.4 F (37.4 C)   Resp 18   LMP 02/02/2013   SpO2 96%   Visual Acuity Right Eye Distance:   Left Eye Distance:   Bilateral Distance:    Right Eye Near:   Left Eye Near:    Bilateral Near:     Physical Exam Vitals and nursing note reviewed.  Constitutional:      General: She is not in acute distress.    Appearance: She is well-developed.  HENT:     Head: Normocephalic and atraumatic.     Right Ear: Tympanic membrane normal.     Left Ear: Tympanic membrane normal.     Nose: Congestion present.     Mouth/Throat:     Mouth: Mucous membranes are moist.     Pharynx: Posterior oropharyngeal erythema present.  Eyes:      Conjunctiva/sclera: Conjunctivae normal.  Cardiovascular:     Rate and Rhythm: Normal rate and regular rhythm.     Heart sounds: Normal heart sounds.  Pulmonary:     Effort: Pulmonary effort is normal. No respiratory distress.     Breath sounds: Normal breath sounds.  Abdominal:     Palpations: Abdomen is soft.     Tenderness: There is no abdominal tenderness.  Musculoskeletal:     Cervical back: Neck supple.  Skin:    General: Skin is warm and dry.  Neurological:     General: No focal deficit present.     Mental Status: She is alert and oriented to person, place, and time.     Gait: Gait normal.  Psychiatric:        Mood and Affect: Mood normal.        Behavior: Behavior normal.      UC Treatments / Results  Labs (all labs ordered are listed, but only abnormal results are displayed) Labs Reviewed  POCT RAPID STREP A, ED / UC    EKG   Radiology No results found.  Procedures Procedures (including critical care time)  Medications Ordered in UC Medications - No data to display  Initial Impression / Assessment and Plan / UC Course  I have reviewed the triage vital signs and the nursing notes.  Pertinent labs & imaging results that were available during my care of the patient were reviewed by me and considered in my medical decision making (see chart for details).   Sore throat, acute tonsillitis, acute sinusitis.  Elevated blood pressure reading.  Rapid strep negative.  Treating with amoxicillin.  Also treating with Diflucan as patient reports she gets vaginal yeast infections when she takes antibiotics.  Instructed patient to take Tylenol or ibuprofen as needed for discomfort.  Instructed patient to follow-up with her PCP if her symptoms are not improving.  Discussed that her blood pressure is elevated today and needs to be rechecked by her PCP in 2 to 4 weeks.  She agrees to plan of care.   Final Clinical Impressions(s) / UC Diagnoses   Final diagnoses:  Sore  throat  Acute tonsillitis, unspecified etiology  Acute non-recurrent maxillary sinusitis  Elevated blood pressure reading     Discharge Instructions     Take the  amoxicillin and Diflucan as directed.  Follow up with your primary care provider if your symptoms are not improving.    Your blood pressure is elevated today at 154/107.  Please have this rechecked by your primary care provider in 2-4 weeks.            ED Prescriptions    Medication Sig Dispense Auth. Provider   amoxicillin (AMOXIL) 875 MG tablet Take 1 tablet (875 mg total) by mouth 2 (two) times daily for 7 days. 14 tablet Mickie Bail, NP   fluconazole (DIFLUCAN) 150 MG tablet Take 1 tablet (150 mg total) by mouth daily. Take one tablet today.  May repeat in 3 days. 2 tablet Mickie Bail, NP     PDMP not reviewed this encounter.   Mickie Bail, NP 01/04/21 1655

## 2021-01-04 NOTE — ED Triage Notes (Signed)
Pt with sore throat for over a month, so painful pt has trouble swallowing even liquids. C/o dry mouth as well.

## 2021-01-04 NOTE — ED Notes (Signed)
Pt c/o feeling as though her "throat is closing up". Pt speaking in complete sentences but states that at times she feels the throat swelling does impede her breathing. Wendee Beavers, NP notified.

## 2021-01-05 ENCOUNTER — Ambulatory Visit (HOSPITAL_COMMUNITY): Payer: Self-pay

## 2021-02-22 ENCOUNTER — Ambulatory Visit (HOSPITAL_COMMUNITY)
Admission: EM | Admit: 2021-02-22 | Discharge: 2021-02-22 | Disposition: A | Discharge status: Home / Self Care | Payer: 59 | Attending: Internal Medicine | Admitting: Internal Medicine

## 2021-02-22 ENCOUNTER — Other Ambulatory Visit: Payer: Self-pay

## 2021-02-22 ENCOUNTER — Encounter (HOSPITAL_COMMUNITY): Payer: Self-pay | Admitting: Emergency Medicine

## 2021-02-22 DIAGNOSIS — J309 Allergic rhinitis, unspecified: Secondary | ICD-10-CM

## 2021-02-22 DIAGNOSIS — R030 Elevated blood-pressure reading, without diagnosis of hypertension: Secondary | ICD-10-CM

## 2021-02-22 MED ORDER — SALINE SPRAY 0.65 % NA SOLN: 2.0000 | NASAL | 0 refills | Status: DC | PRN

## 2021-02-22 NOTE — ED Triage Notes (Signed)
Nose is stuffy.  Has tried over the counter medicines and minimal relief.  Patient unable to get nasal passages cleared with blowing.  Patient feels drainage and stuffiness alternate sides of nose depending on which side she is lying on.  Complains of sob because she is breathing through her mouth.  Has tried many otc remedies, with no relief

## 2021-02-22 NOTE — Discharge Instructions (Addendum)
Continue phenylephrine tablets and triamcinolone nasal spray Start using salt water nasal spray Continue to use fluticasone nasal spray.

## 2021-02-22 NOTE — ED Provider Notes (Addendum)
[facility]    CSN: [id number] Arrival date & time: 02/22/21  1458      History   Chief Complaint Chief Complaint  Patient presents with  . URI    HPI Betty Adams is a 60 y.o. female care with 2 weeks history of nasal congestion.  Patient is currently using phenylephrine tablets, triamcinolone nasal spray and fluticasone nasal spray.  According to the patient her nasal passages are so congested that she is not able to breathe through her nose.  She has bought herself a humidifier with no relief.  She lives in a wood-burning stove house.  She denies any toxic exposures.  No fever or chills.  No shortness of breath or wheezing.  Patient denies any seasonal allergies. HPI  Past Medical History:  Diagnosis Date  . Asthma     There are no problems to display for this patient.   Past Surgical History:  Procedure Laterality Date  . ABCESS DRAINAGE      OB History   No obstetric history on file.      Home Medications    Prior to Admission medications   Medication Sig Start Date End Date Taking? Authorizing Provider  sodium chloride (OCEAN) 0.65 % SOLN nasal spray Place 2 sprays into both nostrils as needed for congestion. 02/22/21  Yes Walda Hertzog, Britta Mccreedy, MD  albuterol (VENTOLIN HFA) 108 (90 Base) MCG/ACT inhaler Inhale 2 puffs into the lungs every 6 (six) hours as needed. For shortness of breath 11/30/20   Wallis Bamberg, PA-C    Family History Family History  Problem Relation Age of Onset  . Cancer Mother     Social History Social History   Tobacco Use  . Smoking status: Former Smoker    Packs/day: 0.50    Types: Cigarettes  . Smokeless tobacco: Never Used  Vaping Use  . Vaping Use: Never used  Substance Use Topics  . Alcohol use: No  . Drug use: No     Allergies   Patient has no known allergies.   Review of Systems Review of Systems  Constitutional: Negative.   HENT: Positive for congestion and rhinorrhea. Negative for sinus pressure,  sinus pain and sore throat.   Respiratory: Negative.  Negative for cough, choking and shortness of breath.   Cardiovascular: Negative.   Gastrointestinal: Negative.   Genitourinary: Negative.   Musculoskeletal: Negative.   Neurological: Negative for headaches.     Physical Exam Triage Vital Signs ED Triage Vitals  Enc Vitals Group     BP 02/22/21 1608 (!) 180/119     Pulse Rate 02/22/21 1608 84     Resp 02/22/21 1608 (!) 23     Temp 02/22/21 1608 99.5 F (37.5 C)     Temp Source 02/22/21 1608 Oral     SpO2 02/22/21 1608 99 %     Weight --      Height --      Head Circumference --      Peak Flow --      Pain Score 02/22/21 1606 0     Pain Loc --      Pain Edu? --      Excl. in GC? --    No data found.  Updated Vital Signs BP (!) 172/123 (BP Location: Right Arm)   Pulse 84   Temp 99.5 F (37.5 C) (Oral)   Resp (!) 23   LMP 02/02/2013   SpO2 99%   Visual Acuity Right Eye Distance:  Left Eye Distance:   Bilateral Distance:    Right Eye Near:   Left Eye Near:    Bilateral Near:     Physical Exam Vitals and nursing note reviewed.  Constitutional:      General: She is not in acute distress.    Appearance: She is not ill-appearing.  HENT:     Right Ear: Tympanic membrane normal.     Left Ear: Tympanic membrane normal.     Nose:     Comments: Nasal turbinates are edematous especially in the right nostril.    Mouth/Throat:     Mouth: Mucous membranes are moist.     Pharynx: No oropharyngeal exudate or posterior oropharyngeal erythema.  Cardiovascular:     Rate and Rhythm: Normal rate and regular rhythm.     Pulses: Normal pulses.     Heart sounds: Normal heart sounds.  Musculoskeletal:        General: Normal range of motion.  Neurological:     Mental Status: She is alert.      UC Treatments / Results  Labs (all labs ordered are listed, but only abnormal results are displayed) Labs Reviewed - No data to display  EKG   Radiology No results  found.  Procedures Procedures (including critical care time)  Medications Ordered in UC Medications - No data to display  Initial Impression / Assessment and Plan / UC Course  I have reviewed the triage vital signs and the nursing notes.  Pertinent labs & imaging results that were available during my care of the patient were reviewed by me and considered in my medical decision making (see chart for details).     1.  Allergic sinusitis: Patient is advised to stop using triamcinolone nasal spray as well as phenylephrine tablets Please use salt water nasal spray at bedtime Continue Flonase use If symptoms worsen please return to urgent care to be reevaluated  2.  Elevated blood pressure without a diagnosis of hypertension: Blood pressure is elevated likely secondary to phenylephrine use Patient is advised to stop using phenylephrine Repeat blood pressure remains elevated.  Patient denies any headaches, chest pain or abdominal pain.   Final Clinical Impressions(s) / UC Diagnoses   Final diagnoses:  Allergic sinusitis  Elevated blood pressure reading     Discharge Instructions     Continue phenylephrine tablets and triamcinolone nasal spray Start using salt water nasal spray Continue to use fluticasone nasal spray.    ED Prescriptions    Medication Sig Dispense Auth. Provider   sodium chloride (OCEAN) 0.65 % SOLN nasal spray Place 2 sprays into both nostrils as needed for congestion. 104 mL Tylan Briguglio, Britta Mccreedy, MD     PDMP not reviewed this encounter.   Merrilee Jansky, MD 02/22/21 1738    Merrilee Jansky, MD 02/22/21 1739

## 2021-09-23 ENCOUNTER — Emergency Department (HOSPITAL_COMMUNITY)
Admission: EM | Admit: 2021-09-23 | Discharge: 2021-09-23 | Disposition: A | Discharge status: Home / Self Care | Payer: PRIVATE HEALTH INSURANCE | Attending: Emergency Medicine | Admitting: Emergency Medicine

## 2021-09-23 ENCOUNTER — Other Ambulatory Visit: Payer: Self-pay

## 2021-09-23 ENCOUNTER — Emergency Department (HOSPITAL_COMMUNITY): Payer: 59

## 2021-09-23 ENCOUNTER — Encounter (HOSPITAL_COMMUNITY): Payer: Self-pay | Admitting: Emergency Medicine

## 2021-09-23 DIAGNOSIS — M25511 Pain in right shoulder: Secondary | ICD-10-CM | POA: Insufficient documentation

## 2021-09-23 DIAGNOSIS — J45909 Unspecified asthma, uncomplicated: Secondary | ICD-10-CM | POA: Insufficient documentation

## 2021-09-23 DIAGNOSIS — M545 Low back pain, unspecified: Secondary | ICD-10-CM | POA: Insufficient documentation

## 2021-09-23 DIAGNOSIS — Z87891 Personal history of nicotine dependence: Secondary | ICD-10-CM | POA: Insufficient documentation

## 2021-09-23 DIAGNOSIS — Y92410 Unspecified street and highway as the place of occurrence of the external cause: Secondary | ICD-10-CM | POA: Insufficient documentation

## 2021-09-23 MED ORDER — IBUPROFEN 800 MG PO TABS
800.0000 mg | ORAL_TABLET | Freq: Once | ORAL | Status: AC
  Administered 2021-09-23: 19:00:00 800 mg via ORAL
  Filled 2021-09-23: qty 1

## 2021-09-23 MED ORDER — METHOCARBAMOL 500 MG PO TABS: 500.0000 mg | ORAL_TABLET | Freq: Two times a day (BID) | ORAL | 0 refills | Status: DC

## 2021-09-23 MED ORDER — CYCLOBENZAPRINE HCL 10 MG PO TABS
5.0000 mg | ORAL_TABLET | Freq: Once | ORAL | Status: AC
  Administered 2021-09-23: 19:00:00 5 mg via ORAL
  Filled 2021-09-23: qty 1

## 2021-09-23 NOTE — ED Notes (Signed)
Patient and PA made aware of the patient's BP. PA spoke with the patient concerning her high blood pressure today.
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
## 2022-03-01 ENCOUNTER — Emergency Department (HOSPITAL_COMMUNITY)
Admission: EM | Admit: 2022-03-01 | Discharge: 2022-03-01 | Disposition: A | Discharge status: Home / Self Care | Payer: 59 | Attending: Emergency Medicine | Admitting: Emergency Medicine

## 2022-03-01 ENCOUNTER — Encounter (HOSPITAL_COMMUNITY): Payer: Self-pay | Admitting: Emergency Medicine

## 2022-03-01 DIAGNOSIS — R1010 Upper abdominal pain, unspecified: Secondary | ICD-10-CM | POA: Diagnosis present

## 2022-03-01 DIAGNOSIS — J45909 Unspecified asthma, uncomplicated: Secondary | ICD-10-CM | POA: Diagnosis not present

## 2022-03-01 DIAGNOSIS — A059 Bacterial foodborne intoxication, unspecified: Secondary | ICD-10-CM | POA: Insufficient documentation

## 2022-03-01 DIAGNOSIS — Z87891 Personal history of nicotine dependence: Secondary | ICD-10-CM | POA: Insufficient documentation

## 2022-03-01 LAB — CBC
HCT: 40.6 % (ref 36.0–46.0)
Hemoglobin: 12.3 g/dL (ref 12.0–15.0)
MCH: 28 pg (ref 26.0–34.0)
MCHC: 30.3 g/dL (ref 30.0–36.0)
MCV: 92.3 fL (ref 80.0–100.0)
Platelets: 186 10*3/uL (ref 150–400)
RBC: 4.4 MIL/uL (ref 3.87–5.11)
RDW: 14.7 % (ref 11.5–15.5)
WBC: 7.3 10*3/uL (ref 4.0–10.5)
nRBC: 0 % (ref 0.0–0.2)

## 2022-03-01 LAB — COMPREHENSIVE METABOLIC PANEL
ALT: 19 U/L (ref 0–44)
AST: 16 U/L (ref 15–41)
Albumin: 3.5 g/dL (ref 3.5–5.0)
Alkaline Phosphatase: 84 U/L (ref 38–126)
Anion gap: 8 (ref 5–15)
BUN: 16 mg/dL (ref 6–20)
CO2: 25 mmol/L (ref 22–32)
Calcium: 9.3 mg/dL (ref 8.9–10.3)
Chloride: 103 mmol/L (ref 98–111)
Creatinine, Ser: 1.05 mg/dL — ABNORMAL HIGH (ref 0.44–1.00)
GFR, Estimated: >60 mL/min (ref >60)
Glucose, Bld: 111 mg/dL — ABNORMAL HIGH (ref 70–99)
Potassium: 3.6 mmol/L (ref 3.5–5.1)
Sodium: 136 mmol/L (ref 135–145)
Total Bilirubin: 0.4 mg/dL (ref 0.3–1.2)
Total Protein: 7.3 g/dL (ref 6.5–8.1)

## 2022-03-01 LAB — LIPASE, BLOOD: Lipase: 22 U/L (ref 11–51)

## 2022-03-01 MED ORDER — ONDANSETRON HCL 4 MG/2ML IJ SOLN
4.0000 mg | Freq: Once | INTRAMUSCULAR | Status: AC
  Administered 2022-03-01: 4 mg via INTRAVENOUS
  Filled 2022-03-01: qty 2

## 2022-03-01 MED ORDER — SODIUM CHLORIDE 0.9 % IV BOLUS
1000.0000 mL | Freq: Once | INTRAVENOUS | Status: AC
  Administered 2022-03-01: 1000 mL via INTRAVENOUS

## 2022-03-01 MED ORDER — SUCRALFATE 1 G PO TABS: 1.0000 g | ORAL_TABLET | Freq: Four times a day (QID) | ORAL | 0 refills | Status: DC | PRN

## 2022-03-01 MED ORDER — PROCHLORPERAZINE MALEATE 10 MG PO TABS: 10.0000 mg | ORAL_TABLET | Freq: Two times a day (BID) | ORAL | 0 refills | Status: DC | PRN

## 2022-03-01 MED ORDER — HALOPERIDOL LACTATE 5 MG/ML IJ SOLN
2.0000 mg | Freq: Once | INTRAMUSCULAR | Status: AC
  Administered 2022-03-01: 2 mg via INTRAVENOUS
  Filled 2022-03-01: qty 1

## 2022-03-01 MED ORDER — FENTANYL CITRATE PF 50 MCG/ML IJ SOSY
25.0000 ug | PREFILLED_SYRINGE | Freq: Once | INTRAMUSCULAR | Status: AC
  Administered 2022-03-01: 25 ug via INTRAVENOUS
  Filled 2022-03-01: qty 1

## 2022-03-01 MED ORDER — FAMOTIDINE IN NACL 20-0.9 MG/50ML-% IV SOLN
20.0000 mg | Freq: Once | INTRAVENOUS | Status: AC
  Administered 2022-03-01: 20 mg via INTRAVENOUS
  Filled 2022-03-01: qty 50

## 2022-03-01 NOTE — ED Provider Notes (Signed)
[facility]-EMERGENCY DEPT [facility] Emergency Department Provider Note MRN:  [id number]  Arrival date & time: 03/01/22     Chief Complaint   Abdominal Pain   History of Present Illness   Betty Adams is a 61 y.o. year-old female with no pertinent past medical history presenting to the ED with chief complaint of abdominal pain.  Patient ate a fish fillet sandwich at [employer] and 10 minutes later she began having upper abdominal discomfort with persistent nausea.  No fever, no diarrhea, no vomiting, no other complaints.  Feels very bad.  Review of Systems  A thorough review of systems was obtained and all systems are negative except as noted in the HPI and PMH.   Patient's Health History    Past Medical History:  Diagnosis Date   Asthma     Past Surgical History:  Procedure Laterality Date   ABCESS DRAINAGE      Family History  Problem Relation Age of Onset   Cancer Mother     Social History   Socioeconomic History   Marital status: Single    Spouse name: Not on file   Number of children: Not on file   Years of education: Not on file   Highest education level: Not on file  Occupational History   Not on file  Tobacco Use   Smoking status: Former    Packs/day: 0.50    Types: Cigarettes   Smokeless tobacco: Never  Vaping Use   Vaping Use: Never used  Substance and Sexual Activity   Alcohol use: No   Drug use: No   Sexual activity: Yes    Birth control/protection: Condom  Other Topics Concern   Not on file  Social History Narrative   Not on file   Social Determinants of Health   Financial Resource Strain: Not on file  Food Insecurity: Not on file  Transportation Needs: Not on file  Physical Activity: Not on file  Stress: Not on file  Social Connections: Not on file  Intimate Partner Violence: Not on file     Physical Exam   Vitals:   03/01/22 0600 03/01/22 0616  BP: 123/85   Pulse: 75   Resp: 17   Temp:  98.1 F (36.7 C)  SpO2: 98%      CONSTITUTIONAL: Well-appearing, NAD NEURO/PSYCH:  Alert and oriented x 3, no focal deficits EYES:  eyes equal and reactive ENT/NECK:  no LAD, no JVD CARDIO: Regular rate, well-perfused, normal S1 and S2 PULM:  CTAB no wheezing or rhonchi GI/GU:  non-distended, non-tender MSK/SPINE:  No gross deformities, no edema SKIN:  no rash, atraumatic   *Additional and/or pertinent findings included in MDM below  Diagnostic and Interventional Summary    EKG Interpretation  Date/Time:  Tuesday Mar 01 2022 03:08:48 EDT Ventricular Rate:  78 PR Interval:  176 QRS Duration: 88 QT Interval:  419 QTC Calculation: 478 R Axis:   80 Text Interpretation: Sinus rhythm Confirmed by Kennis Carina [phone]) on 03/01/2022 3:51:56 AM       Labs Reviewed  COMPREHENSIVE METABOLIC PANEL - Abnormal; Notable for the following components:      Result Value   Glucose, Bld 111 (*)    Creatinine, Ser 1.05 (*)    All other components within normal limits  CBC  LIPASE, BLOOD    No orders to display    Medications  sodium chloride 0.9 % bolus 1,000 mL (0 mLs Intravenous Stopped 03/01/22 0538)  ondansetron (ZOFRAN) injection 4 mg (4 mg  Intravenous Given 03/01/22 0303)  fentaNYL (SUBLIMAZE) injection 25 mcg (25 mcg Intravenous Given 03/01/22 0303)  famotidine (PEPCID) IVPB 20 mg premix (0 mg Intravenous Stopped 03/01/22 0538)  haloperidol lactate (HALDOL) injection 2 mg (2 mg Intravenous Given 03/01/22 0538)     Procedures  /  Critical Care Procedures  ED Course and Medical Decision Making  Initial Impression and Ddx Suspect food poisoning from preformed toxin, well-appearing, normal vital signs, abdomen largely benign.  Will provide symptomatic management, screening labs, reassess  Past medical/surgical history that increases complexity of ED encounter: None  Interpretation of Diagnostics I personally reviewed the laboratory test and my interpretation is as follows: No significant blood count or  electrolyte disturbance      Patient Reassessment and Ultimate Disposition/Management Patient feeling much better after medications listed above, continued reassuring abdominal exam, appropriate for discharge as there is no indication for further testing or admission.  Patient management required discussion with the following services or consulting groups:  None  Complexity of Problems Addressed Acute illness or injury that poses threat of life of bodily function  Additional Data Reviewed and Analyzed Further history obtained from: Further history from spouse/family member  Additional Factors Impacting ED Encounter Risk Use of parenteral controlled substances  Elmer Sow. Pilar Plate, MD [facility] Emergency Medicine [facility] [email or web address]  Final Clinical Impressions(s) / ED Diagnoses     ICD-10-CM   1. Food poisoning  A05.9       ED Discharge Orders          Ordered    sucralfate (CARAFATE) 1 g tablet  4 times daily PRN        03/01/22 0631    prochlorperazine (COMPAZINE) 10 MG tablet  2 times daily PRN        03/01/22 0631             Discharge Instructions Discussed with and Provided to Patient:    Discharge Instructions      You were evaluated in the Emergency Department and after careful evaluation, we did not find any emergent condition requiring admission or further testing in the hospital.  Your exam/testing today is overall reassuring.  Symptoms seem to be due to food poisoning.  Can use the Zofran medication as needed for nausea, can use the Carafate medication as needed for pain.  Please return to the Emergency Department if you experience any worsening of your condition.   Thank you for allowing [place] to be a part of your care.      Sabas Sous, MD 03/01/22 [phone]

## 2022-03-01 NOTE — Discharge Instructions (Signed)
You were evaluated in the Emergency Department and after careful evaluation, we did not find any emergent condition requiring admission or further testing in the hospital.  Your exam/testing today is overall reassuring.  Symptoms seem to be due to food poisoning.  Can use the Zofran medication as needed for nausea, can use the Carafate medication as needed for pain.  Please return to the Emergency Department if you experience any worsening of your condition.   Thank you for allowing [place] to be a part of your care.

## 2022-03-01 NOTE — ED Triage Notes (Signed)
Patient reports pain across her abdomen with nausea after eating fish this evening , no emesis or diarrhea , denies fever or chills .

## 2022-09-02 ENCOUNTER — Ambulatory Visit (HOSPITAL_COMMUNITY)
Admission: EM | Admit: 2022-09-02 | Discharge: 2022-09-02 | Disposition: A | Discharge status: Home / Self Care | Payer: PRIVATE HEALTH INSURANCE | Attending: Physician Assistant | Admitting: Physician Assistant

## 2022-09-02 ENCOUNTER — Encounter (HOSPITAL_COMMUNITY): Payer: Self-pay | Admitting: *Deleted

## 2022-09-02 DIAGNOSIS — K047 Periapical abscess without sinus: Secondary | ICD-10-CM | POA: Diagnosis not present

## 2022-09-02 MED ORDER — AMOXICILLIN 500 MG PO CAPS: 500.0000 mg | ORAL_CAPSULE | Freq: Three times a day (TID) | ORAL | 0 refills | Status: DC

## 2022-09-02 MED ORDER — TRAMADOL HCL 50 MG PO TABS: 50.0000 mg | ORAL_TABLET | Freq: Four times a day (QID) | ORAL | 0 refills | Status: DC | PRN

## 2022-09-02 NOTE — ED Triage Notes (Signed)
Pt states she has a dental abscess on bottom right side she has been taking IBU and tylenol without relief. The pain started yesterday.

## 2022-09-02 NOTE — ED Provider Notes (Signed)
[facility]    CSN: [id number] Arrival date & time: 09/02/22  1449      History   Chief Complaint Chief Complaint  Patient presents with   Dental Pain    HPI Betty Adams is a 61 y.o. female.   Patient here today for evaluation of possible dental abscess that started yesterday.  She reports that she is having pain and swelling to her bottom right jaw area.  She has poor dentition at baseline.  She states that her dentist will not "touch her" until her swelling is improved.  She has been trying to take ibuprofen and Tylenol without significant improvement.  She has not had any fever.  She denies any nausea or vomiting.  The history is provided by the patient.  Dental Pain Associated symptoms: facial swelling   Associated symptoms: no fever     Past Medical History:  Diagnosis Date   Asthma    Hypertension     There are no problems to display for this patient.   Past Surgical History:  Procedure Laterality Date   ABCESS DRAINAGE      OB History   No obstetric history on file.      Home Medications    Prior to Admission medications   Medication Sig Start Date End Date Taking? Authorizing Provider  amoxicillin (AMOXIL) 500 MG capsule Take 1 capsule (500 mg total) by mouth 3 (three) times daily. 09/02/22  Yes Tomi Bamberger, PA-C  ibuprofen (ADVIL) 200 MG tablet Take 600-800 mg by mouth every 6 (six) hours as needed for headache or moderate pain.   Yes [provider]  lisinopril-hydrochlorothiazide (ZESTORETIC) 20-12.5 MG tablet Take 1 tablet by mouth every evening. 02/17/22  Yes [provider]  traMADol (ULTRAM) 50 MG tablet Take 1 tablet (50 mg total) by mouth every 6 (six) hours as needed. 09/02/22  Yes Tomi Bamberger, PA-C  albuterol (VENTOLIN HFA) 108 (90 Base) MCG/ACT inhaler Inhale 2 puffs into the lungs every 6 (six) hours as needed. For shortness of breath 11/30/20   Wallis Bamberg, PA-C  methocarbamol (ROBAXIN) 500 MG tablet  Take 1 tablet (500 mg total) by mouth 2 (two) times daily. Patient not taking: Reported on 03/01/2022 09/23/21   Cristopher Peru, PA-C  prochlorperazine (COMPAZINE) 10 MG tablet Take 1 tablet (10 mg total) by mouth 2 (two) times daily as needed for nausea. 03/01/22   Sabas Sous, MD  sodium chloride (OCEAN) 0.65 % SOLN nasal spray Place 2 sprays into both nostrils as needed for congestion. 02/22/21   Lamptey, Britta Mccreedy, MD  sucralfate (CARAFATE) 1 g tablet Take 1 tablet (1 g total) by mouth 4 (four) times daily as needed. 03/01/22   Sabas Sous, MD    Family History Family History  Problem Relation Age of Onset   Cancer Mother     Social History Social History   Tobacco Use   Smoking status: Former    Packs/day: 0.50    Types: Cigarettes   Smokeless tobacco: Never  Vaping Use   Vaping Use: Never used  Substance Use Topics   Alcohol use: No   Drug use: No     Allergies   Patient has no known allergies.   Review of Systems Review of Systems  Constitutional:  Negative for chills and fever.  HENT:  Positive for dental problem and facial swelling.   Eyes:  Negative for discharge and redness.  Gastrointestinal:  Negative for abdominal pain, nausea  and vomiting.     Physical Exam Triage Vital Signs ED Triage Vitals  Enc Vitals Group     BP 09/02/22 1600 (!) 177/86     Pulse Rate 09/02/22 1600 72     Resp 09/02/22 1600 18     Temp 09/02/22 1600 99.5 F (37.5 C)     Temp Source 09/02/22 1600 Oral     SpO2 09/02/22 1600 97 %     Weight --      Height --      Head Circumference --      Peak Flow --      Pain Score 09/02/22 1558 10     Pain Loc --      Pain Edu? --      Excl. in GC? --    No data found.  Updated Vital Signs BP (!) 177/86 (BP Location: Right Arm)   Pulse 72   Temp 99.5 F (37.5 C) (Oral)   Resp 18   LMP 02/02/2013   SpO2 97%      Physical Exam Vitals and nursing note reviewed.  Constitutional:      General: She is not in acute  distress.    Appearance: Normal appearance. She is not ill-appearing.  HENT:     Head: Normocephalic and atraumatic.     Comments: Mild swelling noted to right mandibular area    Nose: Nose normal. No congestion or rhinorrhea.     Mouth/Throat:     Comments: Poor dentition throughout, multiple missing,  broken teeth, diffuse gingival inflammation Eyes:     Conjunctiva/sclera: Conjunctivae normal.  Cardiovascular:     Rate and Rhythm: Normal rate.  Pulmonary:     Effort: Pulmonary effort is normal.  Neurological:     Mental Status: She is alert.  Psychiatric:        Mood and Affect: Mood normal.        Behavior: Behavior normal.        Thought Content: Thought content normal.      UC Treatments / Results  Labs (all labs ordered are listed, but only abnormal results are displayed) Labs Reviewed - No data to display  EKG   Radiology No results found.  Procedures Procedures (including critical care time)  Medications Ordered in UC Medications - No data to display  Initial Impression / Assessment and Plan / UC Course  I have reviewed the triage vital signs and the nursing notes.  Pertinent labs & imaging results that were available during my care of the patient were reviewed by me and considered in my medical decision making (see chart for details).   Will treat dental abscess with amoxicillin and tramadol for pain. Encouraged follow up if no gradual improvement or with any further concerns. Recommend further evaluation by dentistry when infection is clear.   Final Clinical Impressions(s) / UC Diagnoses   Final diagnoses:  Dental abscess   Discharge Instructions   None    ED Prescriptions     Medication Sig Dispense Auth. Provider   amoxicillin (AMOXIL) 500 MG capsule Take 1 capsule (500 mg total) by mouth 3 (three) times daily. 21 capsule Erma Pinto F, PA-C   traMADol (ULTRAM) 50 MG tablet Take 1 tablet (50 mg total) by mouth every 6 (six) hours as  needed. 15 tablet Tomi Bamberger, PA-C      I have reviewed the PDMP during this encounter.   Tomi Bamberger, PA-C 09/02/22 [phone]

## 2022-09-04 ENCOUNTER — Other Ambulatory Visit: Payer: Self-pay

## 2022-09-04 ENCOUNTER — Encounter (HOSPITAL_COMMUNITY): Payer: Self-pay | Admitting: *Deleted

## 2022-09-04 ENCOUNTER — Ambulatory Visit (HOSPITAL_COMMUNITY)
Admission: EM | Admit: 2022-09-04 | Discharge: 2022-09-04 | Disposition: A | Discharge status: Home / Self Care | Payer: PRIVATE HEALTH INSURANCE | Attending: Internal Medicine | Admitting: Internal Medicine

## 2022-09-04 DIAGNOSIS — K047 Periapical abscess without sinus: Secondary | ICD-10-CM | POA: Diagnosis not present

## 2022-09-04 MED ORDER — AMOXICILLIN-POT CLAVULANATE 875-125 MG PO TABS: 1.0000 | ORAL_TABLET | Freq: Two times a day (BID) | ORAL | 0 refills | Status: DC

## 2022-09-04 MED ORDER — CHLORHEXIDINE GLUCONATE 0.12 % MT SOLN: 15.0000 mL | Freq: Two times a day (BID) | OROMUCOSAL | 0 refills | Status: DC

## 2022-09-04 NOTE — ED Triage Notes (Signed)
PT seen and treated on 01-31-22 for dental abscess. Pt reports she now has facial swelling. Pt also wants meds to treat yeast infection she gets when taking anti-bx.

## 2022-09-04 NOTE — ED Provider Notes (Signed)
[facility]    CSN: [id number] Arrival date & time: 09/04/22  1149      History   Chief Complaint Chief Complaint  Patient presents with   Facial Swelling    HPI Betty Adams is a 61 y.o. female comes to the urgent care with worsening right jaw pain.  Patient was seen in the urgent care a few days ago for dental abscess.  She was prescribed amoxicillin and tramadol.  Patient says the pain has not improved despite ibuprofen and tramadol use.  She is also developed a swelling of the right jaw extending to the right chin area.  Pain is currently severe throbbing and aggravated by palpation.  No known relieving factors.  She denies any fever or chills.  No nausea or vomiting.  No tongue swelling.  Right lower lip swelling present. HPI  Past Medical History:  Diagnosis Date   Asthma    Hypertension     There are no problems to display for this patient.   Past Surgical History:  Procedure Laterality Date   ABCESS DRAINAGE      OB History   No obstetric history on file.      Home Medications    Prior to Admission medications   Medication Sig Start Date End Date Taking? Authorizing Provider  amoxicillin-clavulanate (AUGMENTIN) 875-125 MG tablet Take 1 tablet by mouth every 12 (twelve) hours. 09/04/22  Yes Akshay Spang, Britta Mccreedy, MD  chlorhexidine (PERIDEX) 0.12 % solution Use as directed 15 mLs in the mouth or throat 2 (two) times daily. 09/04/22  Yes Koichi Platte, Britta Mccreedy, MD  albuterol (VENTOLIN HFA) 108 (90 Base) MCG/ACT inhaler Inhale 2 puffs into the lungs every 6 (six) hours as needed. For shortness of breath 11/30/20   Wallis Bamberg, PA-C  lisinopril-hydrochlorothiazide (ZESTORETIC) 20-12.5 MG tablet Take 1 tablet by mouth every evening. 02/17/22   [provider]  prochlorperazine (COMPAZINE) 10 MG tablet Take 1 tablet (10 mg total) by mouth 2 (two) times daily as needed for nausea. 03/01/22   Sabas Sous, MD  sodium chloride (OCEAN) 0.65 % SOLN nasal spray  Place 2 sprays into both nostrils as needed for congestion. 02/22/21   Shekelia Boutin, Britta Mccreedy, MD  sucralfate (CARAFATE) 1 g tablet Take 1 tablet (1 g total) by mouth 4 (four) times daily as needed. 03/01/22   Sabas Sous, MD  traMADol (ULTRAM) 50 MG tablet Take 1 tablet (50 mg total) by mouth every 6 (six) hours as needed. 09/02/22   Tomi Bamberger, PA-C    Family History Family History  Problem Relation Age of Onset   Cancer Mother     Social History Social History   Tobacco Use   Smoking status: Former    Packs/day: 0.50    Types: Cigarettes   Smokeless tobacco: Never  Vaping Use   Vaping Use: Never used  Substance Use Topics   Alcohol use: No   Drug use: No     Allergies   Patient has no known allergies.   Review of Systems Review of Systems  Constitutional: Negative.   HENT:  Positive for dental problem.   Respiratory: Negative.    Musculoskeletal:  Negative for arthralgias, joint swelling and myalgias.  Neurological:  Positive for facial asymmetry. Negative for seizures, speech difficulty and headaches.     Physical Exam Triage Vital Signs ED Triage Vitals  Enc Vitals Group     BP 09/04/22 1536 131/84     Pulse Rate 09/04/22 1536  89     Resp 09/04/22 1536 18     Temp 09/04/22 1536 98.4 F (36.9 C)     Temp src --      SpO2 09/04/22 1536 93 %     Weight --      Height --      Head Circumference --      Peak Flow --      Pain Score 09/04/22 1534 10     Pain Loc --      Pain Edu? --      Excl. in GC? --    No data found.  Updated Vital Signs BP 131/84   Pulse 89   Temp 98.4 F (36.9 C)   Resp 18   LMP 02/02/2013   SpO2 93%   Visual Acuity Right Eye Distance:   Left Eye Distance:   Bilateral Distance:    Right Eye Near:   Left Eye Near:    Bilateral Near:     Physical Exam Vitals and nursing note reviewed.  Constitutional:      Appearance: Normal appearance.  HENT:     Right Ear: Tympanic membrane normal.     Left Ear: Tympanic  membrane normal.     Ears:     Comments: Swelling over the right jaw.  Tenderness to palpation.  Patient is able to open her mouth.  No trismus.  Poor dental hygiene noted with dental cavities in the first right mandibular premolar and right mandibular canine.  . Neurological:     Mental Status: She is alert.      UC Treatments / Results  Labs (all labs ordered are listed, but only abnormal results are displayed) Labs Reviewed - No data to display  EKG   Radiology No results found.  Procedures Procedures (including critical care time)  Medications Ordered in UC Medications - No data to display  Initial Impression / Assessment and Plan / UC Course  I have reviewed the triage vital signs and the nursing notes.  Pertinent labs & imaging results that were available during my care of the patient were reviewed by me and considered in my medical decision making (see chart for details).     1.  Dental abscess: Discontinue amoxicillin Start Augmentin twice daily for 7 days Chlorhexidine mouth rinse as needed If swelling worsens, patient is advised to go to emergency department for further evaluation. Final Clinical Impressions(s) / UC Diagnoses   Final diagnoses:  Dental abscess     Discharge Instructions      Please take medications as prescribed Please do not take more than 800 mg of ibuprofen at a time Use mouthwash and antibiotics as directed Stop taking amoxicillin and start taking Augmentin If you have worsening swelling that spreads across your chin to the other jaw please go to the emergency room for further evaluation.   ED Prescriptions     Medication Sig Dispense Auth. Provider   amoxicillin-clavulanate (AUGMENTIN) 875-125 MG tablet Take 1 tablet by mouth every 12 (twelve) hours. 14 tablet [place], Britta Mccreedy, MD   chlorhexidine (PERIDEX) 0.12 % solution Use as directed 15 mLs in the mouth or throat 2 (two) times daily. 120 mL Rosilyn Coachman, Britta Mccreedy, MD       PDMP not reviewed this encounter.   Merrilee Jansky, MD 09/04/22 2059

## 2022-09-04 NOTE — Discharge Instructions (Signed)
Please take medications as prescribed Please do not take more than 800 mg of ibuprofen at a time Use mouthwash and antibiotics as directed Stop taking amoxicillin and start taking Augmentin If you have worsening swelling that spreads across your chin to the other jaw please go to the emergency room for further evaluation.

## 2023-01-16 ENCOUNTER — Encounter (HOSPITAL_COMMUNITY): Payer: Self-pay

## 2023-01-16 ENCOUNTER — Ambulatory Visit (HOSPITAL_COMMUNITY)
Admission: EM | Admit: 2023-01-16 | Discharge: 2023-01-16 | Disposition: A | Discharge status: Home / Self Care | Payer: 59 | Attending: Physician Assistant | Admitting: Physician Assistant

## 2023-01-16 DIAGNOSIS — M62830 Muscle spasm of back: Secondary | ICD-10-CM

## 2023-01-16 DIAGNOSIS — M546 Pain in thoracic spine: Secondary | ICD-10-CM

## 2023-01-16 MED ORDER — CYCLOBENZAPRINE HCL 5 MG PO TABS: 5.0000 mg | ORAL_TABLET | Freq: Three times a day (TID) | ORAL | 0 refills | Status: DC | PRN

## 2023-01-16 MED ORDER — HYDROCODONE-ACETAMINOPHEN 5-325 MG PO TABS: 1.0000 | ORAL_TABLET | Freq: Three times a day (TID) | ORAL | 0 refills | Status: DC

## 2023-01-16 NOTE — ED Provider Notes (Signed)
[facility]    CSN: [id number] Arrival date & time: 01/16/23  0900      History   Chief Complaint Chief Complaint  Patient presents with   Back Pain    HPI Betty Adams is a 62 y.o. female.   62 year old female presents with left upper back pain.  Patient indicates for the past week she has been having progressive and persistent left upper back pain at the shoulder blade.  She relates that the pain is worse when she tries to turn, bend, reach, or twist.  She indicates getting the most relief when she sits and just breathes shallow.  She does not have pain when she is breathing deep.  She describes the pain as being a 10 on a scale of 1-10, intermittent but persistent.  She indicates taking ibuprofen and Tylenol without relief from her discomfort.  She also indicates she has been using heat without relief also.  She denies history of trauma to the area, no unusual rashes, no fever or chills.  She indicates there is no right shoulder pain.   Back Pain   Past Medical History:  Diagnosis Date   Asthma    Hypertension     There are no problems to display for this patient.   Past Surgical History:  Procedure Laterality Date   ABCESS DRAINAGE      OB History   No obstetric history on file.      Home Medications    Prior to Admission medications   Medication Sig Start Date End Date Taking? Authorizing Provider  cyclobenzaprine (FLEXERIL) 5 MG tablet Take 1 tablet (5 mg total) by mouth 3 (three) times daily as needed for muscle spasms. 01/16/23  Yes Ellsworth Lennox, PA-C  HYDROcodone-acetaminophen (NORCO/VICODIN) 5-325 MG tablet Take 1-2 tablets by mouth 3 (three) times daily. 01/16/23  Yes Ellsworth Lennox, PA-C  lisinopril-hydrochlorothiazide (ZESTORETIC) 20-12.5 MG tablet Take 1 tablet by mouth every evening. 02/17/22  Yes [provider]  albuterol (VENTOLIN HFA) 108 (90 Base) MCG/ACT inhaler Inhale 2 puffs into the lungs every 6 (six) hours as needed. For  shortness of breath 11/30/20   Wallis Bamberg, PA-C  amoxicillin-clavulanate (AUGMENTIN) 875-125 MG tablet Take 1 tablet by mouth every 12 (twelve) hours. 09/04/22   Lamptey, Britta Mccreedy, MD  chlorhexidine (PERIDEX) 0.12 % solution Use as directed 15 mLs in the mouth or throat 2 (two) times daily. 09/04/22   Lamptey, Britta Mccreedy, MD  prochlorperazine (COMPAZINE) 10 MG tablet Take 1 tablet (10 mg total) by mouth 2 (two) times daily as needed for nausea. 03/01/22   Sabas Sous, MD  sodium chloride (OCEAN) 0.65 % SOLN nasal spray Place 2 sprays into both nostrils as needed for congestion. 02/22/21   Lamptey, Britta Mccreedy, MD  sucralfate (CARAFATE) 1 g tablet Take 1 tablet (1 g total) by mouth 4 (four) times daily as needed. 03/01/22   Sabas Sous, MD  traMADol (ULTRAM) 50 MG tablet Take 1 tablet (50 mg total) by mouth every 6 (six) hours as needed. 09/02/22   Tomi Bamberger, PA-C    Family History Family History  Problem Relation Age of Onset   Cancer Mother     Social History Social History   Tobacco Use   Smoking status: Former    Packs/day: .5    Types: Cigarettes   Smokeless tobacco: Never  Vaping Use   Vaping Use: Never used  Substance Use Topics   Alcohol use: No   Drug  use: No     Allergies   Patient has no known allergies.   Review of Systems Review of Systems  Musculoskeletal:  Positive for back pain (right shoulder blade area.).     Physical Exam Triage Vital Signs ED Triage Vitals  Enc Vitals Group     BP 01/16/23 1138 (!) 157/96     Pulse Rate 01/16/23 1139 82     Resp 01/16/23 1138 18     Temp 01/16/23 1138 97.6 F (36.4 C)     Temp Source 01/16/23 1138 Oral     SpO2 01/16/23 1138 96 %     Weight --      Height --      Head Circumference --      Peak Flow --      Pain Score --      Pain Loc --      Pain Edu? --      Excl. in GC? --    No data found.  Updated Vital Signs BP (!) 157/96 (BP Location: Left Arm)   Pulse 82   Temp 97.6 F (36.4 C) (Oral)    Resp 18   LMP 02/02/2013   SpO2 96%   Visual Acuity Right Eye Distance:   Left Eye Distance:   Bilateral Distance:    Right Eye Near:   Left Eye Near:    Bilateral Near:     Physical Exam Constitutional:      Appearance: Normal appearance.  Cardiovascular:     Rate and Rhythm: Normal rate and regular rhythm.     Heart sounds: Normal heart sounds.  Pulmonary:     Effort: Pulmonary effort is normal.     Breath sounds: Normal breath sounds and air entry. No wheezing, rhonchi or rales.  Musculoskeletal:       Arms:     Comments: Back: Pain is palpated a long the paraspinous area at the mid scapula inferior to around T10, no unusual redness or swelling is present.  No rashes present.  Right shoulder range of motion is normal without aggravating pain.  Neurological:     Mental Status: She is alert.      UC Treatments / Results  Labs (all labs ordered are listed, but only abnormal results are displayed) Labs Reviewed - No data to display  EKG   Radiology No results found.  Procedures Procedures (including critical care time)  Medications Ordered in UC Medications - No data to display  Initial Impression / Assessment and Plan / UC Course  I have reviewed the triage vital signs and the nursing notes.  Pertinent labs & imaging results that were available during my care of the patient were reviewed by me and considered in my medical decision making (see chart for details).    Plan: The diagnosis will be treated with the following: 1.  Muscle spasm of back: A.  Flexeril 5 mg every 8 hours to decrease spasm and irritability. 2.  Acute right-sided thoracic pain: A.  Vicodin tablets 5/325 mg, 1-2 every 8 hours as needed for pain. B.  Ice therapy, 10 minutes on 20 minutes off, 3-4 times throughout the day to help reduce pain and spasm. 3.  Advised follow-up PCP return to urgent care as needed. Final Clinical Impressions(s) / UC Diagnoses   Final diagnoses:  Muscle  spasm of back  Acute right-sided thoracic back pain     Discharge Instructions      Advised to take the Flexeril 5 mg  every 8 hours on a regular basis to help decrease muscle spasm and irritability. Advised take the Vicodin 5/325 mg, 1-2 every 8 hours as needed for pain relief.  (Be cautious with this medication as it does cause sedation in some individuals) Advised to use ice therapy, 10 minutes on, 3-4 times throughout the day to help relieve pain and discomfort.  Advised follow-up PCP return to urgent care if symptoms fail to improve.    ED Prescriptions     Medication Sig Dispense Auth. Provider   cyclobenzaprine (FLEXERIL) 5 MG tablet Take 1 tablet (5 mg total) by mouth 3 (three) times daily as needed for muscle spasms. 30 tablet Ellsworth Lennox, PA-C   HYDROcodone-acetaminophen (NORCO/VICODIN) 5-325 MG tablet Take 1-2 tablets by mouth 3 (three) times daily. 18 tablet Ellsworth Lennox, PA-C      I have reviewed the PDMP during this encounter.   Ellsworth Lennox, PA-C 01/16/23 1203

## 2023-01-16 NOTE — ED Triage Notes (Signed)
Pt presents to office upper back pain that started last week. Pt denies any falls or trauma.

## 2023-01-16 NOTE — Discharge Instructions (Signed)
Advised to take the Flexeril 5 mg every 8 hours on a regular basis to help decrease muscle spasm and irritability. Advised take the Vicodin 5/325 mg, 1-2 every 8 hours as needed for pain relief.  (Be cautious with this medication as it does cause sedation in some individuals) Advised to use ice therapy, 10 minutes on, 3-4 times throughout the day to help relieve pain and discomfort.  Advised follow-up PCP return to urgent care if symptoms fail to improve.
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
## 2023-10-19 ENCOUNTER — Encounter (HOSPITAL_COMMUNITY): Payer: Self-pay

## 2023-10-19 ENCOUNTER — Ambulatory Visit (HOSPITAL_COMMUNITY)
Admission: RE | Admit: 2023-10-19 | Discharge: 2023-10-19 | Disposition: A | Discharge status: Home / Self Care | Payer: 59 | Source: Ambulatory Visit | Attending: Family Medicine | Admitting: Family Medicine

## 2023-10-19 VITALS — BP 148/100 | HR 87 | Temp 98.1°F | Resp 16

## 2023-10-19 DIAGNOSIS — J111 Influenza due to unidentified influenza virus with other respiratory manifestations: Secondary | ICD-10-CM

## 2023-10-19 LAB — POC COVID19/FLU A&B COMBO
Covid Antigen, POC: NEGATIVE
Influenza A Antigen, POC: NEGATIVE
Influenza B Antigen, POC: NEGATIVE

## 2023-10-19 MED ORDER — BENZONATATE 200 MG PO CAPS: 200.0000 mg | ORAL_CAPSULE | Freq: Three times a day (TID) | ORAL | 0 refills | Status: DC | PRN

## 2023-10-19 MED ORDER — ALBUTEROL SULFATE HFA 108 (90 BASE) MCG/ACT IN AERS: 2.0000 | INHALATION_SPRAY | Freq: Four times a day (QID) | RESPIRATORY_TRACT | 0 refills | Status: AC | PRN

## 2023-10-19 MED ORDER — OSELTAMIVIR PHOSPHATE 75 MG PO CAPS: 75.0000 mg | ORAL_CAPSULE | Freq: Two times a day (BID) | ORAL | 0 refills | Status: DC

## 2023-10-19 NOTE — ED Notes (Signed)
Patient insisted that she obtain Covid /Flu test herself.

## 2023-10-19 NOTE — ED Triage Notes (Addendum)
Patient c/o a non productive cough, body aches, nasal congestion, ears feel full, and a headache x 2 days. Patient states she tested negative for Covid at work today.  Patient reports that she has been taking zinc, cepacol and cough lozenges with no relief.

## 2023-10-19 NOTE — Discharge Instructions (Signed)
You were seen today for URI symptoms. You flu and covid were negative here today.  However, given you close exposures and your history of asthma I would like to treat you for flu with tamiflu twice/day x 5 days.  I have also sent out a medication to help with cough.   You should continue your inhaler as well.  I recommend you get plenty of rest, and increase fluids.  Use tylenol for pain/fever.  Please return if you are not improving or worsening.

## 2023-10-19 NOTE — ED Provider Notes (Signed)
[facility]    CSN: [id number] Arrival date & time: 10/19/23  6295      History   Chief Complaint Chief Complaint  Patient presents with   Cough   Generalized Body Aches   Nasal Congestion   Headache    HPI Betty Adams is a 63 y.o. female.    Cough Associated symptoms: headaches, myalgias, rhinorrhea and sore throat   Headache Associated symptoms: congestion, cough, fatigue, myalgias and sore throat    Patient is here for URI symptoms. X 2 days.  Having sinus congestion, drainage, sore throat, ear are full, headache, body aches.  Has a cough, feeling sob at times.  She does have asthma, and used her inhaler with help.  Using otc medications Her husband tested positive for flu, as well as her daughter.  She did a test at work and was negative for flu/covid.        Past Medical History:  Diagnosis Date   Asthma    Hypertension     There are no active problems to display for this patient.   Past Surgical History:  Procedure Laterality Date   ABCESS DRAINAGE      OB History   No obstetric history on file.      Home Medications    Prior to Admission medications   Medication Sig Start Date End Date Taking? Authorizing Provider  albuterol (VENTOLIN HFA) 108 (90 Base) MCG/ACT inhaler Inhale 2 puffs into the lungs every 6 (six) hours as needed. For shortness of breath 11/30/20   Wallis Bamberg, PA-C  amoxicillin-clavulanate (AUGMENTIN) 875-125 MG tablet Take 1 tablet by mouth every 12 (twelve) hours. 09/04/22   Lamptey, Britta Mccreedy, MD  chlorhexidine (PERIDEX) 0.12 % solution Use as directed 15 mLs in the mouth or throat 2 (two) times daily. 09/04/22   Lamptey, Britta Mccreedy, MD  cyclobenzaprine (FLEXERIL) 5 MG tablet Take 1 tablet (5 mg total) by mouth 3 (three) times daily as needed for muscle spasms. 01/16/23   Ellsworth Lennox, PA-C  HYDROcodone-acetaminophen (NORCO/VICODIN) 5-325 MG tablet Take 1-2 tablets by mouth 3 (three) times daily. 01/16/23   Ellsworth Lennox, PA-C  lisinopril-hydrochlorothiazide (ZESTORETIC) 20-12.5 MG tablet Take 1 tablet by mouth every evening. 02/17/22   [provider]  prochlorperazine (COMPAZINE) 10 MG tablet Take 1 tablet (10 mg total) by mouth 2 (two) times daily as needed for nausea. 03/01/22   Sabas Sous, MD  sodium chloride (OCEAN) 0.65 % SOLN nasal spray Place 2 sprays into both nostrils as needed for congestion. 02/22/21   Lamptey, Britta Mccreedy, MD  sucralfate (CARAFATE) 1 g tablet Take 1 tablet (1 g total) by mouth 4 (four) times daily as needed. 03/01/22   Sabas Sous, MD  traMADol (ULTRAM) 50 MG tablet Take 1 tablet (50 mg total) by mouth every 6 (six) hours as needed. 09/02/22   Tomi Bamberger, PA-C    Family History Family History  Problem Relation Age of Onset   Cancer Mother     Social History Social History   Tobacco Use   Smoking status: Former    Current packs/day: 0.50    Types: Cigarettes   Smokeless tobacco: Never  Vaping Use   Vaping status: Never Used  Substance Use Topics   Alcohol use: No   Drug use: No     Allergies   Other   Review of Systems Review of Systems  Constitutional:  Positive for fatigue.  HENT:  Positive for congestion, rhinorrhea  and sore throat.   Respiratory:  Positive for cough.   Gastrointestinal: Negative.   Musculoskeletal:  Positive for arthralgias and myalgias.  Neurological:  Positive for headaches.  Psychiatric/Behavioral: Negative.       Physical Exam Triage Vital Signs ED Triage Vitals [10/19/23 1018]  Encounter Vitals Group     BP (!) 148/100     Systolic BP Percentile      Diastolic BP Percentile      Pulse Rate 87     Resp 16     Temp 98.1 F (36.7 C)     Temp Source Oral     SpO2 94 %     Weight      Height      Head Circumference      Peak Flow      Pain Score 7     Pain Loc      Pain Education      Exclude from Growth Chart    No data found.  Updated Vital Signs BP (!) 148/100 (BP Location: Right Arm)    Pulse 87   Temp 98.1 F (36.7 C) (Oral)   Resp 16   LMP 02/02/2013   SpO2 94%   Visual Acuity Right Eye Distance:   Left Eye Distance:   Bilateral Distance:    Right Eye Near:   Left Eye Near:    Bilateral Near:     Physical Exam Constitutional:      General: She is not in acute distress.    Appearance: She is well-developed and normal weight. She is not ill-appearing or toxic-appearing.  HENT:     Right Ear: There is impacted cerumen.     Left Ear: There is impacted cerumen.     Nose:     Right Sinus: No maxillary sinus tenderness or frontal sinus tenderness.     Left Sinus: No maxillary sinus tenderness or frontal sinus tenderness.     Mouth/Throat:     Mouth: Mucous membranes are moist.     Pharynx: Oropharynx is clear.  Cardiovascular:     Rate and Rhythm: Normal rate and regular rhythm.  Pulmonary:     Effort: Pulmonary effort is normal.     Breath sounds: Normal breath sounds.  Musculoskeletal:        General: Normal range of motion.     Cervical back: Normal range of motion and neck supple.  Lymphadenopathy:     Cervical: No cervical adenopathy.  Skin:    General: Skin is warm.  Neurological:     Mental Status: She is alert and oriented to person, place, and time.  Psychiatric:        Mood and Affect: Mood normal.      UC Treatments / Results  Labs (all labs ordered are listed, but only abnormal results are displayed) Labs Reviewed  POC COVID19/FLU A&B COMBO    EKG   Radiology No results found.  Procedures Procedures (including critical care time)  Medications Ordered in UC Medications - No data to display  Initial Impression / Assessment and Plan / UC Course  I have reviewed the triage vital signs and the nursing notes.  Pertinent labs & imaging results that were available during my care of the patient were reviewed by me and considered in my medical decision making (see chart for details).   Final Clinical Impressions(s) / UC  Diagnoses   Final diagnoses:  Influenza with respiratory manifestation     Discharge Instructions  You were seen today for URI symptoms. You flu and covid were negative here today.  However, given you close exposures and your history of asthma I would like to treat you for flu with tamiflu twice/day x 5 days.  I have also sent out a medication to help with cough.   You should continue your inhaler as well.  I recommend you get plenty of rest, and increase fluids.  Use tylenol for pain/fever.  Please return if you are not improving or worsening.     ED Prescriptions     Medication Sig Dispense Auth. Provider   oseltamivir (TAMIFLU) 75 MG capsule Take 1 capsule (75 mg total) by mouth every 12 (twelve) hours. 10 capsule Jannifer Franklin, MD      PDMP not reviewed this encounter.   Jannifer Franklin, MD 10/19/23 1054

## 2023-11-16 ENCOUNTER — Other Ambulatory Visit: Payer: Self-pay

## 2023-11-16 ENCOUNTER — Ambulatory Visit (HOSPITAL_COMMUNITY)
Admission: EM | Admit: 2023-11-16 | Discharge: 2023-11-16 | Disposition: A | Discharge status: Home / Self Care | Payer: 59 | Attending: Internal Medicine | Admitting: Internal Medicine

## 2023-11-16 ENCOUNTER — Encounter (HOSPITAL_COMMUNITY): Payer: Self-pay | Admitting: *Deleted

## 2023-11-16 DIAGNOSIS — I10 Essential (primary) hypertension: Secondary | ICD-10-CM | POA: Diagnosis not present

## 2023-11-16 DIAGNOSIS — G4489 Other headache syndrome: Secondary | ICD-10-CM | POA: Diagnosis not present

## 2023-11-16 MED ORDER — BLOOD PRESSURE CUFF MISC: 1.0000 [IU] | Freq: Every day | 0 refills | Status: AC

## 2023-11-16 MED ORDER — LISINOPRIL-HYDROCHLOROTHIAZIDE 20-12.5 MG PO TABS: 1.0000 | ORAL_TABLET | Freq: Every day | ORAL | Status: AC

## 2023-11-16 MED ORDER — ACETAMINOPHEN 325 MG PO TABS
975.0000 mg | ORAL_TABLET | Freq: Once | ORAL | Status: AC
  Administered 2023-11-16: 975 mg via ORAL

## 2023-11-16 MED ORDER — ACETAMINOPHEN 325 MG PO TABS
ORAL_TABLET | ORAL | Status: AC
  Filled 2023-11-16: qty 3

## 2023-11-16 MED ORDER — LISINOPRIL 20 MG PO TABS: 20.0000 mg | ORAL_TABLET | Freq: Every day | ORAL | Status: DC

## 2023-11-16 NOTE — Discharge Instructions (Addendum)
Monitor your blood pressure and keep a log of it so you can call your doctor or show your doctor those numbers If more than 140/90, then your medication may need to be adjusted Make sure to take your blood pressure medication every morning when you wake up Also discuss with your primary doctor about your snoring and trouble breathing on your back, so they can test you for sleep apnea.  You may take Tylenol up to 1000 mg every 8 hours for headache, but avoid Ibuprofen.  Follow up with your primary care doctor next week.

## 2023-11-16 NOTE — ED Provider Notes (Signed)
[facility]    CSN: [id number] Arrival date & time: 11/16/23  1439      History   Chief Complaint Chief Complaint  Patient presents with   Hypertension    HPI Betty Adams is a 63 y.o. female who presents due to having elevated BP while at work of 180/111 . She has been having a HA on her R forehead, obit region x 2 days. She denies hx of chronic HA's. She was recently diagnosed of HTN by her PCP around November and was placed on Lisinopril/hydrochlorothiazide and was supposed to have a FU last month, but she never made her appointment. She has noticed palpitations and admits of being very stressed lately, and when she takes deep breaths her palpitations resolve. She denies chest pains. She took Ibuprofen 600 mg PO at noon, but did not seem to help. Has hx of edema, and snores and has to sleep reclined, due to waking up having trouble breathing and snoring. She has not had a sleep test. Has not had URI or fever.  HA pain level 8/10  Past Medical History:  Diagnosis Date   Asthma    Hypertension     There are no active problems to display for this patient.   Past Surgical History:  Procedure Laterality Date   ABCESS DRAINAGE      OB History   No obstetric history on file.      Home Medications    Prior to Admission medications   Medication Sig Start Date End Date Taking? Authorizing Provider  albuterol (VENTOLIN HFA) 108 (90 Base) MCG/ACT inhaler Inhale 2 puffs into the lungs every 6 (six) hours as needed. For shortness of breath 10/19/23  Yes Piontek, Erin, MD  Blood Pressure Monitoring (BLOOD PRESSURE CUFF) MISC 1 Units by Does not apply route daily. Large cuff, automatic Dx HTN 11/16/23  Yes Rodriguez-Southworth, Nettie Elm, PA-C  lisinopril-hydrochlorothiazide (ZESTORETIC) 20-12.5 MG tablet Take 1 tablet by mouth daily. 11/16/23  Yes Rodriguez-Southworth, Nettie Elm, PA-C    Family History Family History  Problem Relation Age of Onset   Cancer Mother      Social History Social History   Tobacco Use   Smoking status: Former    Current packs/day: 0.50    Types: Cigarettes   Smokeless tobacco: Never  Vaping Use   Vaping status: Never Used  Substance Use Topics   Alcohol use: No   Drug use: No     Allergies   Other   Review of Systems Review of Systems As noted inHPI  Physical Exam Triage Vital Signs ED Triage Vitals  Encounter Vitals Group     BP 11/16/23 1448 (!) 160/84     Systolic BP Percentile --      Diastolic BP Percentile --      Pulse Rate 11/16/23 1448 89     Resp 11/16/23 1448 18     Temp 11/16/23 1448 (!) 96.4 F (35.8 C)     Temp src --      SpO2 11/16/23 1448 99 %     Weight --      Height --      Head Circumference --      Peak Flow --      Pain Score 11/16/23 1452 9     Pain Loc --      Pain Education --      Exclude from Growth Chart --    No data found.  Updated Vital Signs BP (!) 145/88  Pulse 89   Temp (!) 96.4 F (35.8 C)   Resp 18   LMP 02/02/2013   SpO2 99%   Visual Acuity Right Eye Distance:   Left Eye Distance:   Bilateral Distance:    Right Eye Near:   Left Eye Near:    Bilateral Near:     Physical Exam Physical Exam Vitals signs and nursing note reviewed.  Constitutional:      General: SHe is not in acute distress.    Appearance: SHe is obese, and  is not ill-appearing, toxic-appearing or diaphoretic.  HENT:     Head: Normocephalic.  Eyes:     Extraocular Movements: Extraocular movements intact.     Pupils: Pupils are equal, round, and reactive to light.  Neck:     Musculoskeletal: Neck supple. No carotid bruits heard.  Cardiovascular:     Rate and Rhythm: Normal rate and regular rhythm.     Heart sounds: No murmur. Has indention on her ankle from her sock, but there is no pitting.  Pulmonary:     Effort: Pulmonary effort is normal.     Breath sounds: Normal breath sounds. No wheezing, rhonchi or rales.  Abdominal:     General: Bowel sounds are normal.      Palpations: Abdomen is soft. There is no mass.     Tenderness: There is no abdominal tenderness. There is no guarding.  Musculoskeletal: Normal range of motion.  Lymphadenopathy:     Cervical: No cervical adenopathy.  Skin:    General: Skin is warm and dry.  Neurological: no face asymmetry noted    Mental Status: She is alert.     Cranial Nerves: No cranial nerve deficit or facial asymmetry.     Sensory: No sensory deficit.     Motor: No weakness.     Coordination: Romberg sign negative. Coordination normal.     Gait: Gait normal.     Deep Tendon Reflexes: Reflexes normal.      Psychiatric:        Mood and Affect: Mood normal.        Speech: Speech normal.        Behavior: Behavior normal.    UC Treatments / Results  Labs (all labs ordered are listed, but only abnormal results are displayed) Labs Reviewed - No data to display  EKG   Radiology No results found.  Procedures Procedures (including critical care time)  Medications Ordered in UC Medications  acetaminophen (TYLENOL) tablet 975 mg (975 mg Oral Given 11/16/23 1556)    Initial Impression / Assessment and Plan / UC Course  I have reviewed the triage vital signs and the nursing notes. She was given Tylenol 975 mg PO and her HA pain level went down to 5/10 and her BP went down as well.   HA Elevated BP due to pain and stress Snoring with possible sleep apnea  Pt may continue Tylenol as noted in instructions. Advised to monitor her BP and keep a log and show it to her PCP. See instructions.      Final Clinical Impressions(s) / UC Diagnoses   Final diagnoses:  Uncontrolled hypertension  Other headache syndrome     Discharge Instructions      Monitor your blood pressure and keep a log of it so you can call your doctor or show your doctor those numbers If more than 140/90, then your medication may need to be adjusted Make sure to take your blood pressure medication every morning when you  wake  up Also discuss with your primary doctor about your snoring and trouble breathing on your back, so they can test you for sleep apnea.  You may take Tylenol up to 1000 mg every 8 hours for headache, but avoid Ibuprofen.  Follow up with your primary care doctor next week.      ED Prescriptions     Medication Sig Dispense Auth. Provider   lisinopril (ZESTRIL) 20 MG tablet  (Status: Discontinued) Take 1 tablet (20 mg total) by mouth daily. -- Rodriguez-Southworth, Nettie Elm, PA-C   Blood Pressure Monitoring (BLOOD PRESSURE CUFF) MISC 1 Units by Does not apply route daily. Large cuff, automatic Dx HTN 1 each Rodriguez-Southworth, Nettie Elm, PA-C   lisinopril-hydrochlorothiazide (ZESTORETIC) 20-12.5 MG tablet Take 1 tablet by mouth daily. -- Rodriguez-Southworth, Nettie Elm, PA-C      PDMP not reviewed this encounter.   Garey Ham, [place] 11/16/23 1639

## 2023-11-16 NOTE — ED Triage Notes (Signed)
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 Pt takes her BP meds in the afternon. BP 160/84 during triage. Pt was sent to [facility] by staff a Pt's work lace. Pt had a HA that started yesterday.

## 2024-01-22 ENCOUNTER — Encounter (HOSPITAL_COMMUNITY): Payer: Self-pay

## 2024-01-22 ENCOUNTER — Ambulatory Visit (HOSPITAL_COMMUNITY)
Admission: EM | Admit: 2024-01-22 | Discharge: 2024-01-22 | Disposition: A | Discharge status: Home / Self Care | Attending: Emergency Medicine | Admitting: Emergency Medicine

## 2024-01-22 DIAGNOSIS — B349 Viral infection, unspecified: Secondary | ICD-10-CM

## 2024-01-22 MED ORDER — ACETAMINOPHEN 325 MG PO TABS
ORAL_TABLET | ORAL | Status: AC
  Filled 2024-01-22: qty 2

## 2024-01-22 MED ORDER — LIDOCAINE VISCOUS HCL 2 % MT SOLN
15.0000 mL | OROMUCOSAL | 0 refills | Status: AC | PRN
Start: 2024-01-22 — End: ?

## 2024-01-22 MED ORDER — ACETAMINOPHEN 325 MG PO TABS
650.0000 mg | ORAL_TABLET | Freq: Once | ORAL | Status: AC
  Administered 2024-01-22: 650 mg via ORAL

## 2024-01-22 MED ORDER — LOPERAMIDE HCL 2 MG PO CAPS: 2.0000 mg | ORAL_CAPSULE | Freq: Four times a day (QID) | ORAL | 0 refills | Status: AC | PRN

## 2024-01-22 MED ORDER — BENZONATATE 100 MG PO CAPS: 100.0000 mg | ORAL_CAPSULE | Freq: Three times a day (TID) | ORAL | 0 refills | Status: AC

## 2024-01-22 MED ORDER — AZELASTINE HCL 0.1 % NA SOLN: 2.0000 | Freq: Two times a day (BID) | NASAL | 0 refills | Status: AC

## 2024-01-22 MED ORDER — PROMETHAZINE-DM 6.25-15 MG/5ML PO SYRP: 5.0000 mL | ORAL_SOLUTION | Freq: Every evening | ORAL | 0 refills | Status: AC | PRN

## 2024-01-22 NOTE — ED Triage Notes (Signed)
 Patient here today with c/o cough, nasal congestion, wheeze, SOB, and diarrhea X 1 day. Patient vomited yesterday. She tried using Theraflu but she threw it up.

## 2024-01-22 NOTE — ED Notes (Signed)
 Pt states she had negative Covid and influenza tests done at her place of employment approx 1 hr ago. Provider notified.

## 2024-01-22 NOTE — ED Provider Notes (Signed)
 [facility]    CSN: [id number] Arrival date & time: 01/22/24  1310      History   Chief Complaint Chief Complaint  Patient presents with   Cough    HPI Betty Adams is a 63 y.o. female.   Patient presents with cough, congestion, sore throat, body aches, intermittent wheezing and shortness of breath, and diarrhea that began yesterday.  Patient states that she did well up 1 episode of vomiting yesterday.  Denies known fever, chest pain, and abdominal pain.  Patient states that she tried taking TheraFlu yesterday but threw it up.  Patient denies taking any other medications for symptoms  Patient states while she was at work today she was tested for flu and COVID which were both negative.  Patient does report a history of asthma, but denies using her inhaler.   Cough   Past Medical History:  Diagnosis Date   Asthma    Hypertension     There are no active problems to display for this patient.   Past Surgical History:  Procedure Laterality Date   ABCESS DRAINAGE      OB History   No obstetric history on file.      Home Medications    Prior to Admission medications   Medication Sig Start Date End Date Taking? Authorizing Provider  azelastine  (ASTELIN ) 0.1 % nasal spray Place 2 sprays into both nostrils 2 (two) times daily. Use in each nostril as directed 01/22/24  Yes Levora Reas A, NP  benzonatate  (TESSALON ) 100 MG capsule Take 1 capsule (100 mg total) by mouth every 8 (eight) hours. 01/22/24  Yes Rosevelt Constable, Yailene Badia A, NP  lidocaine  (XYLOCAINE ) 2 % solution Use as directed 15 mLs in the mouth or throat as needed for mouth pain. 01/22/24  Yes Rosevelt Constable, Teonia Yager A, NP  loperamide  (IMODIUM ) 2 MG capsule Take 1 capsule (2 mg total) by mouth 4 (four) times daily as needed for diarrhea or loose stools. 01/22/24  Yes Levora Reas A, NP  promethazine -dextromethorphan (PROMETHAZINE -DM) 6.25-15 MG/5ML syrup Take 5 mLs by mouth at bedtime as needed for cough.  01/22/24  Yes Rosevelt Constable, Sharnetta Gielow A, NP  albuterol  (VENTOLIN  HFA) 108 (90 Base) MCG/ACT inhaler Inhale 2 puffs into the lungs every 6 (six) hours as needed. For shortness of breath 10/19/23   Lesle Ras, MD  Blood Pressure Monitoring (BLOOD PRESSURE CUFF) MISC 1 Units by Does not apply route daily. Large cuff, automatic Dx HTN 11/16/23   Rodriguez-Southworth, Lamond Pilot, PA-C  lisinopril -hydrochlorothiazide  (ZESTORETIC ) 20-12.5 MG tablet Take 1 tablet by mouth daily. 11/16/23   Rodriguez-Southworth, Lamond Pilot, PA-C    Family History Family History  Problem Relation Age of Onset   Cancer Mother     Social History Social History   Tobacco Use   Smoking status: Former    Current packs/day: 0.50    Types: Cigarettes   Smokeless tobacco: Never  Vaping Use   Vaping status: Never Used  Substance Use Topics   Alcohol use: No   Drug use: No     Allergies   Other   Review of Systems Review of Systems  Respiratory:  Positive for cough.    Per HPI  Physical Exam Triage Vital Signs ED Triage Vitals  Encounter Vitals Group     BP 01/22/24 1343 137/85     Systolic BP Percentile --      Diastolic BP Percentile --      Pulse Rate 01/22/24 1343 83     Resp 01/22/24 1343  16     Temp 01/22/24 1343 98.4 F (36.9 C)     Temp Source 01/22/24 1343 Oral     SpO2 01/22/24 1343 95 %     Weight 01/22/24 1345 280 lb (127 kg)     Height 01/22/24 1345 5\' 1"  (1.549 m)     Head Circumference --      Peak Flow --      Pain Score 01/22/24 1344 6     Pain Loc --      Pain Education --      Exclude from Growth Chart --    No data found.  Updated Vital Signs BP 137/85 (BP Location: Right Arm)   Pulse 83   Temp 98.4 F (36.9 C) (Oral)   Resp 16   Ht 5\' 1"  (1.549 m)   Wt 280 lb (127 kg)   LMP 02/02/2013   SpO2 95%   BMI 52.91 kg/m   Visual Acuity Right Eye Distance:   Left Eye Distance:   Bilateral Distance:    Right Eye Near:   Left Eye Near:    Bilateral Near:     Physical  Exam Vitals and nursing note reviewed.  Constitutional:      General: She is awake. She is not in acute distress.    Appearance: Normal appearance. She is well-developed and well-groomed. She is not ill-appearing.  HENT:     Right Ear: Tympanic membrane, ear canal and external ear normal.     Left Ear: Tympanic membrane, ear canal and external ear normal.     Nose: Congestion and rhinorrhea present.     Mouth/Throat:     Mouth: Mucous membranes are moist.     Pharynx: Posterior oropharyngeal erythema present. No oropharyngeal exudate.  Cardiovascular:     Rate and Rhythm: Normal rate and regular rhythm.  Pulmonary:     Effort: Pulmonary effort is normal.     Breath sounds: Normal breath sounds.  Skin:    General: Skin is warm and dry.  Neurological:     Mental Status: She is alert.  Psychiatric:        Behavior: Behavior is cooperative.      UC Treatments / Results  Labs (all labs ordered are listed, but only abnormal results are displayed) Labs Reviewed - No data to display  EKG   Radiology No results found.  Procedures Procedures (including critical care time)  Medications Ordered in UC Medications  acetaminophen  (TYLENOL ) tablet 650 mg (650 mg Oral Given 01/22/24 1442)    Initial Impression / Assessment and Plan / UC Course  I have reviewed the triage vital signs and the nursing notes.  Pertinent labs & imaging results that were available during my care of the patient were reviewed by me and considered in my medical decision making (see chart for details).     Patient is well-appearing.  Vitals are stable.  Congestion and rhinorrhea are present, mild erythema noted to pharynx.  Lungs clear bilaterally auscultation.  Symptoms likely viral in nature.  Prescribed Tessalon  and Promethazine  DM as needed for cough.  Prescribed lidocaine  as needed for sore throat.  Prescribed azelastine  nasal spray for congestion.  Prescribed Imodium  as needed for diarrhea.   Recommended Tylenol  as needed for body aches, headache, sore throat, and fever.  Discussed importance of hydration.  Discussed return precautions. Final Clinical Impressions(s) / UC Diagnoses   Final diagnoses:  Acute viral syndrome     Discharge Instructions  Take Tessalon  every 8 hours as needed for cough. Take promethazine  DM cough syrup at night for cough.  This can make you drowsy so do not drive, work, or drink alcohol while taking this. Use lidocaine  solution as needed for sore throat.  Gargle and spit this. Use azelastine  nasal spray twice daily to help with congestion. Take Imodium  4 times daily as needed for diarrhea. Otherwise you can take Tylenol  every 4-6 hours as needed for body aches, headache, sore throat, and any fever. Make sure you are staying hydrated and getting plenty of rest. Return here if symptoms persist or worsen.    ED Prescriptions     Medication Sig Dispense Auth. Provider   benzonatate  (TESSALON ) 100 MG capsule Take 1 capsule (100 mg total) by mouth every 8 (eight) hours. 21 capsule Levora Reas A, NP   promethazine -dextromethorphan (PROMETHAZINE -DM) 6.25-15 MG/5ML syrup Take 5 mLs by mouth at bedtime as needed for cough. 118 mL Rosevelt Constable, Caoimhe Damron A, NP   azelastine  (ASTELIN ) 0.1 % nasal spray Place 2 sprays into both nostrils 2 (two) times daily. Use in each nostril as directed 30 mL Levora Reas A, NP   loperamide  (IMODIUM ) 2 MG capsule Take 1 capsule (2 mg total) by mouth 4 (four) times daily as needed for diarrhea or loose stools. 12 capsule Levora Reas A, NP   lidocaine  (XYLOCAINE ) 2 % solution Use as directed 15 mLs in the mouth or throat as needed for mouth pain. 100 mL Levora Reas A, NP      PDMP not reviewed this encounter.   Levora Reas A, NP 01/22/24 [phone]

## 2024-01-22 NOTE — Discharge Instructions (Addendum)
 Take Tessalon  every 8 hours as needed for cough. Take promethazine  DM cough syrup at night for cough.  This can make you drowsy so do not drive, work, or drink alcohol while taking this. Use lidocaine  solution as needed for sore throat.  Gargle and spit this. Use azelastine  nasal spray twice daily to help with congestion. Take Imodium  4 times daily as needed for diarrhea. Otherwise you can take Tylenol  every 4-6 hours as needed for body aches, headache, sore throat, and any fever. Make sure you are staying hydrated and getting plenty of rest. Return here if symptoms persist or worsen.

## 2024-01-25 ENCOUNTER — Encounter (HOSPITAL_COMMUNITY): Payer: Self-pay

## 2024-01-25 ENCOUNTER — Other Ambulatory Visit: Payer: Self-pay

## 2024-01-25 ENCOUNTER — Emergency Department (HOSPITAL_COMMUNITY)
Admission: EM | Admit: 2024-01-25 | Discharge: 2024-01-26 | Disposition: A | Discharge status: Home / Self Care | Attending: Emergency Medicine | Admitting: Emergency Medicine

## 2024-01-25 ENCOUNTER — Emergency Department (HOSPITAL_COMMUNITY)

## 2024-01-25 DIAGNOSIS — I10 Essential (primary) hypertension: Secondary | ICD-10-CM | POA: Insufficient documentation

## 2024-01-25 DIAGNOSIS — J069 Acute upper respiratory infection, unspecified: Secondary | ICD-10-CM | POA: Diagnosis not present

## 2024-01-25 DIAGNOSIS — Z79899 Other long term (current) drug therapy: Secondary | ICD-10-CM | POA: Diagnosis not present

## 2024-01-25 DIAGNOSIS — J45909 Unspecified asthma, uncomplicated: Secondary | ICD-10-CM | POA: Insufficient documentation

## 2024-01-25 DIAGNOSIS — R059 Cough, unspecified: Secondary | ICD-10-CM | POA: Diagnosis present

## 2024-01-25 NOTE — ED Triage Notes (Signed)
 Hx of asthma but having cough x 3 days that is causing pain in her chest.  Reports its hard to take a deep breath.  And went to UC and they wouldn't do a chest xray and that is all she wants.

## 2024-01-25 NOTE — ED Provider Triage Note (Signed)
 Emergency Medicine Provider Triage Evaluation Note  Betty Adams , a 63 y.o. female  was evaluated in triage.  Pt complains of cough. Chest discomfort, cough and congestion since yesterday.  Had negative covid/flu test at [facility] and was prescribed supportive care.  Is here due to concerns and wants further evaluation.   Review of Systems  Positive: As above Negative: As above  Physical Exam  BP (!) 153/100   Pulse 83   Temp 98.8 F (37.1 C)   Resp 18   Ht 5\' 1"  (1.549 m)   Wt 127 kg   LMP 02/02/2013   SpO2 97%   BMI 52.91 kg/m  Gen:   Awake, no distress   Resp:  Normal effort  MSK:   Moves extremities without difficulty  Other:    Medical Decision Making  Medically screening exam initiated at 3:08 PM.  Appropriate orders placed.  SUNDRA HADDIX was informed that the remainder of the evaluation will be completed by another provider, this initial triage assessment does not replace that evaluation, and the importance of remaining in the ED until their evaluation is complete.     Debbra Fairy, PA-C 01/25/24 [phone]

## 2024-01-26 NOTE — ED Provider Notes (Signed)
 [place] EMERGENCY DEPARTMENT AT [facility] Provider Note  CSN: [id number] Arrival date & time: 01/25/24 1439  Chief Complaint(s) Cough  HPI Betty Adams is a 63 y.o. female here with several days of nasal congestion and cough.  Seen at urgent care yesterday and diagnosed with a viral illness.  Prescribed several medications.  Return because she has been having chest discomfort with coughing and the urgent care did not get x-rays.  Chest pain is only there with coughing and palpation.  It is nonexertional.  Nonradiating.  The history is provided by the patient.    Past Medical History Past Medical History:  Diagnosis Date   Asthma    Hypertension    There are no active problems to display for this patient.  Home Medication(s) Prior to Admission medications   Medication Sig Start Date End Date Taking? Authorizing Provider  albuterol  (VENTOLIN  HFA) 108 (90 Base) MCG/ACT inhaler Inhale 2 puffs into the lungs every 6 (six) hours as needed. For shortness of breath 10/19/23   Lesle Ras, MD  azelastine  (ASTELIN ) 0.1 % nasal spray Place 2 sprays into both nostrils 2 (two) times daily. Use in each nostril as directed 01/22/24   Levora Reas A, NP  benzonatate  (TESSALON ) 100 MG capsule Take 1 capsule (100 mg total) by mouth every 8 (eight) hours. 01/22/24   Levora Reas A, NP  Blood Pressure Monitoring (BLOOD PRESSURE CUFF) MISC 1 Units by Does not apply route daily. Large cuff, automatic Dx HTN 11/16/23   Rodriguez-Southworth, Lamond Pilot, PA-C  lidocaine  (XYLOCAINE ) 2 % solution Use as directed 15 mLs in the mouth or throat as needed for mouth pain. 01/22/24   Levora Reas A, NP  lisinopril -hydrochlorothiazide  (ZESTORETIC ) 20-12.5 MG tablet Take 1 tablet by mouth daily. 11/16/23   Rodriguez-Southworth, Sylvia, PA-C  loperamide  (IMODIUM ) 2 MG capsule Take 1 capsule (2 mg total) by mouth 4 (four) times daily as needed for diarrhea or loose stools. 01/22/24   Levora Reas  A, NP  promethazine -dextromethorphan (PROMETHAZINE -DM) 6.25-15 MG/5ML syrup Take 5 mLs by mouth at bedtime as needed for cough. 01/22/24   Karon Packer, NP                                                                                                                                    Allergies Other  Review of Systems Review of Systems As noted in HPI  Physical Exam Vital Signs  I have reviewed the triage vital signs BP (!) 150/93 (BP Location: Right Arm)   Pulse 79   Temp 98.4 F (36.9 C)   Resp 16   Ht 5\' 1"  (1.549 m)   Wt 127 kg   LMP 02/02/2013   SpO2 99%   BMI 52.91 kg/m   Physical Exam Vitals reviewed.  Constitutional:      General: She is not in acute distress.    Appearance: She is well-developed. She is  not diaphoretic.  HENT:     Head: Normocephalic and atraumatic.     Nose: Nose normal.  Eyes:     General: No scleral icterus.       Right eye: No discharge.        Left eye: No discharge.     Conjunctiva/sclera: Conjunctivae normal.     Pupils: Pupils are equal, round, and reactive to light.  Cardiovascular:     Rate and Rhythm: Normal rate and regular rhythm.     Heart sounds: No murmur heard.    No friction rub. No gallop.  Pulmonary:     Effort: Pulmonary effort is normal. No respiratory distress.     Breath sounds: Normal breath sounds. No stridor. No rales.  Chest:    Abdominal:     General: There is no distension.     Palpations: Abdomen is soft.     Tenderness: There is no abdominal tenderness.  Musculoskeletal:        General: No tenderness.     Cervical back: Normal range of motion and neck supple.  Skin:    General: Skin is warm and dry.     Findings: No erythema or rash.  Neurological:     Mental Status: She is alert and oriented to person, place, and time.     ED Results and Treatments Labs (all labs ordered are listed, but only abnormal results are displayed) Labs Reviewed - No data to display                                                                                                                        EKG  EKG Interpretation Date/Time:    Ventricular Rate:    PR Interval:    QRS Duration:    QT Interval:    QTC Calculation:   R Axis:      Text Interpretation:         Radiology DG Chest 2 View Result Date: 01/25/2024 CLINICAL DATA:  Cough. EXAM: CHEST - 2 VIEW COMPARISON:  Chest radiograph dated 05/01/2013. FINDINGS: There is blunting of the costophrenic angles which may represent trace effusion. Faint bilateral interstitial densities may represent atelectasis or atypical infiltrate. No focal consolidation or pneumothorax. Stable cardiac silhouette. No acute osseous pathology. IMPRESSION: 1. Probable trace bilateral pleural effusions. 2. Faint bilateral interstitial densities may represent atelectasis or atypical infiltrate. Electronically Signed   By: Angus Bark M.D.   On: 01/25/2024 16:39    Medications Ordered in ED Medications - No data to display Procedures Procedures  (including critical care time) Medical Decision Making / ED Course   Medical Decision Making   Patient presents with viral symptoms for 3 days. adequate oral hydration. Rest of history as above.  Patient appears well. No signs of toxicity, patient is interactive. No hypoxia, tachypnea or other signs of respiratory distress. No sign of clinical dehydration. Lung exam clear. Rest of exam as above.  Chest x-ray with bibasilar atelectasis.  Most consistent  with viral illness   No evidence suggestive of pharyngitis, AOM, PNA, or meningitis.   Discussed symptomatic treatment with the patient and they will follow closely with their PCP.      Final Clinical Impression(s) / ED Diagnoses Final diagnoses:  Viral URI with cough   The patient appears reasonably screened and/or stabilized for discharge and I doubt any other medical condition or other [facility] requiring further screening, evaluation, or treatment in the  ED at this time. I have discussed the findings, Dx and Tx plan with the patient/family who expressed understanding and agree(s) with the plan. Discharge instructions discussed at length. The patient/family was given strict return precautions who verbalized understanding of the instructions. No further questions at time of discharge.  Disposition: Discharge  Condition: Good  ED Discharge Orders     None         Follow Up: Primary care provider  Call  to schedule an appointment for close follow up    This chart was dictated using voice recognition software.  Despite best efforts to proofread,  errors can occur which can change the documentation meaning.    Lindle Rhea, MD 01/26/24 [phone]

## 2024-10-15 ENCOUNTER — Other Ambulatory Visit: Payer: Self-pay | Admitting: Internal Medicine

## 2024-10-15 DIAGNOSIS — Z1231 Encounter for screening mammogram for malignant neoplasm of breast: Secondary | ICD-10-CM

## 2024-11-06 ENCOUNTER — Ambulatory Visit
Admission: RE | Admit: 2024-11-06 | Discharge: 2024-11-06 | Disposition: A | Source: Ambulatory Visit | Attending: Internal Medicine | Admitting: Internal Medicine

## 2024-11-06 DIAGNOSIS — Z1231 Encounter for screening mammogram for malignant neoplasm of breast: Secondary | ICD-10-CM
